# Patient Record
Sex: Male | Born: 1945 | Race: White | Hispanic: No | Smoking: Former smoker
Health system: Southern US, Community
[De-identification: ages and names within clinical notes are randomized; demographics above are authoritative.]

## PROBLEM LIST (undated history)

## (undated) DIAGNOSIS — N529 Male erectile dysfunction, unspecified: Secondary | ICD-10-CM

## (undated) DIAGNOSIS — N183 Chronic kidney disease, stage 3 (moderate): Secondary | ICD-10-CM

## (undated) DIAGNOSIS — E118 Type 2 diabetes mellitus with unspecified complications: Secondary | ICD-10-CM

## (undated) DIAGNOSIS — M26629 Arthralgia of temporomandibular joint, unspecified side: Secondary | ICD-10-CM

## (undated) DIAGNOSIS — I10 Essential (primary) hypertension: Secondary | ICD-10-CM

## (undated) DIAGNOSIS — N185 Chronic kidney disease, stage 5: Secondary | ICD-10-CM

## (undated) DIAGNOSIS — G4700 Insomnia, unspecified: Secondary | ICD-10-CM

## (undated) DIAGNOSIS — C679 Malignant neoplasm of bladder, unspecified: Secondary | ICD-10-CM

## (undated) DIAGNOSIS — M722 Plantar fascial fibromatosis: Secondary | ICD-10-CM

## (undated) DIAGNOSIS — N179 Acute kidney failure, unspecified: Secondary | ICD-10-CM

## (undated) DIAGNOSIS — E785 Hyperlipidemia, unspecified: Secondary | ICD-10-CM

## (undated) DIAGNOSIS — E1142 Type 2 diabetes mellitus with diabetic polyneuropathy: Secondary | ICD-10-CM

## (undated) DIAGNOSIS — R740 Nonspecific elevation of levels of transaminase and lactic acid dehydrogenase [LDH]: Secondary | ICD-10-CM

## (undated) HISTORY — DX: Plantar fascial fibromatosis: M72.2

## (undated) HISTORY — DX: Malignant neoplasm of bladder, unspecified: C67.9

## (undated) HISTORY — DX: Insomnia, unspecified: G47.00

## (undated) HISTORY — PX: URINARY SPHINCTER REVISION: SHX2625

## (undated) HISTORY — DX: Chronic kidney disease, stage 3 (moderate): N18.3

## (undated) HISTORY — DX: Essential (primary) hypertension: I10

## (undated) HISTORY — DX: Chronic kidney disease, stage 5: N18.5

## (undated) HISTORY — DX: Nonspecific elevation of levels of transaminase and lactic acid dehydrogenase (ldh): R74.0

## (undated) HISTORY — DX: Arthralgia of temporomandibular joint, unspecified side: M26.629

## (undated) HISTORY — DX: Male erectile dysfunction, unspecified: N52.9

## (undated) HISTORY — DX: Hyperlipidemia, unspecified: E78.5

## (undated) HISTORY — DX: Type 2 diabetes mellitus with diabetic polyneuropathy: E11.42

## (undated) HISTORY — DX: Type 2 diabetes mellitus with unspecified complications: E11.8

---
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
## 1994-11-02 DIAGNOSIS — C679 Malignant neoplasm of bladder, unspecified: Secondary | ICD-10-CM

## 1994-11-02 HISTORY — DX: Malignant neoplasm of bladder, unspecified: C67.9

## 1995-11-03 HISTORY — PX: CYSTECTOMY: SHX5119

## 2007-12-16 ENCOUNTER — Ambulatory Visit (HOSPITAL_COMMUNITY): Admission: RE | Admit: 2007-12-16 | Discharge: 2007-12-16 | Payer: Self-pay | Admitting: Family Medicine

## 2010-12-12 ENCOUNTER — Ambulatory Visit (INDEPENDENT_AMBULATORY_CARE_PROVIDER_SITE_OTHER): Payer: Self-pay | Admitting: Family Medicine

## 2010-12-12 ENCOUNTER — Encounter: Payer: Self-pay | Admitting: Family Medicine

## 2010-12-12 DIAGNOSIS — J069 Acute upper respiratory infection, unspecified: Secondary | ICD-10-CM | POA: Insufficient documentation

## 2010-12-18 NOTE — Assessment & Plan Note (Signed)
Summary: Congestion   Vital Signs:  Patient profile:   66 year old male Weight:      237.50 pounds O2 Sat:      95 % on Room air Temp:     97.9 degrees F oral Pulse rate:   88 / minute BP sitting:   96 / 66  (right arm) Cuff size:   large  Vitals Entered By: Abelino Derrick CMA Deborra Medina) (December 12, 2010 10:26 AM)  O2 Flow:  Room air CC: congestion//SP, URI symptoms Is Patient Diabetic? Yes   History of Present Illness: Alexander [name] is familiar to me from my practice in [place], [place]--this is his first visit here.      This is a 65 year old WM who presents with URI symptoms for the last 7d.  The patient complains of nasal congestion, clear nasal discharge, and sick contacts, but denies sore throat.  The patient denies fever, dyspnea, and wheezing.  He has some cough with this illness that has not bothered him much until this morning. Cough a bit worse this morning but overall feels better the last 24h or so. He took amoxil x 3 doses at onset (of his own accord) and also has been taking large amounts of aspirin frequently to try to get his symptoms to go away. Lots of nyquil/dayquil as well.   Glucoses have been up " a little" with the illness.    Preventive Screening-Counseling & Management  Alcohol-Tobacco     Alcohol drinks/day: 0     Smoking Status: never  Current Medications (verified): 1)  Lisinopril 30 Mg Tabs (Lisinopril) .... 1/2 Tab By Mouth Qd 2)  Aspirin 81 Mg Tbec (Aspirin) .Marland Kitchen.. 1 Tab By Mouth Qd 3)  Cialis 2.5 Mg Tabs (Tadalafil) .Marland Kitchen.. 1 Tab By Mouth Qd 4)  Novolog 100 Unit/ml Soln (Insulin Aspart) .... Pre-Meal 5)  Humulin 70/30 70-30 % Susp (Insulin Isophane & Regular) .... 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  Family History: Last updated: 12/12/2010 Noncontributory  Social History: Last updated: 12/12/2010 Married, no biologic children but 2 adopted children from [place]. Retired Production manager, now in full time ministry/missionary. No T/A/Ds.   Regular exercise-yes  Risk Factors: Alcohol Use: 0 (12/12/2010) Exercise: yes (12/12/2010)  Risk Factors: Smoking Status: never (12/12/2010)  Family History: Noncontributory  Social History: Smoking Status:  never  Review of Systems  The patient denies anorexia, fever, weight loss, weight gain, vision loss, chest pain, syncope, dyspnea on exertion, peripheral edema, prolonged cough, headaches, hemoptysis, abdominal pain, melena, hematochezia, severe indigestion/heartburn, hematuria, incontinence, genital sores, muscle weakness, suspicious skin lesions, transient blindness, difficulty walking, depression, unusual weight change, abnormal bleeding, enlarged lymph nodes, angioedema, breast masses, and testicular masses.    Physical Exam  General:  VS normal Gen: alert, NAD, NONTOXIC. HEENT: eyes without injection, drainage, or swelling.  Ears: EACs clear, TMs with normal light reflex and landmarks.  Nose: some dried, crusty exudate adherent to some mildly injected mucosa.  No purulent d/c.  No paranasal sinus TTP.  No facial  swelling.  Throat and mouth without focal lesion.  No pharyngial swelling, erythema, or exudate.   Neck: supple, no LAD.  LUNGS: CTA bilat, nonlabored resps.  CV: RRR, no m/r/g.     Impression & Recommendations:  Problem # 1:  VIRAL URI (ICD-465.9) Assessment  New Symptomatic care discussed.  Discussed trial of z-pack IF symptoms not improving in 3d or if worsening prior to that. Therapeutic expectations and side effect profile of medication discussed today.  Patient's questions answered.  Complete Medication List: 1)  Lisinopril 30 Mg Tabs (Lisinopril) .... 1/2 tab by mouth qd 2)  Aspirin 81 Mg Tbec (Aspirin) .Marland Kitchen.. 1 tab by mouth qd 3)  Cialis 2.5 Mg Tabs (Tadalafil) .Marland Kitchen.. 1 tab by mouth qd 4)  Novolog 100 Unit/ml Soln (Insulin aspart) .... Pre-meal 5)  Humulin 70/30 70-30 % Susp (Insulin isophane & regular) .... Subcutaneously bid 6)  Zithromax Z-pak 250 Mg Tabs (Azithromycin) .... As directed  Patient Instructions: 1)  Try mucinex DM or Zyrtec D. 2)  If just having lots of coughing then take delsym. 3)  Fill your prescription for antibiotic if you are not still improving in 3d or if worse before then. 4)  AVOID ASPIRIN ALWAYS. 5)  Follow up for routine diabetes check in the next 1-2 months. Prescriptions: ZITHROMAX Z-PAK 250 MG TABS (AZITHROMYCIN) as directed  #1 pack x 0   Entered and Authorized by:   Kelle Darting M.D.   Signed by:   Kelle Darting M.D. on 12/12/2010   Method used:   Electronically to        [facility] (retail)       [address] [place],   [id number]       Ph: [phone]       Fax: [phone]   RxID:   [id number]    Orders Added: 1)  Est. Patient Level III [id number]

## 2010-12-18 NOTE — Miscellaneous (Signed)
  Clinical Lists Changes  Medications: Added new medication of LISINOPRIL 30 MG TABS (LISINOPRIL) 1/2 tab by mouth qd Added new medication of ASPIRIN 81 MG TBEC (ASPIRIN) 1 tab by mouth qd Added new medication of CIALIS 2.5 MG TABS (TADALAFIL) 1 tab by mouth qd Added new medication of NOVOLOG 100 UNIT/ML SOLN (INSULIN ASPART) pre-meal Added new medication of HUMULIN 70/30 70-30 % SUSP (INSULIN ISOPHANE & REGULAR) Subcutaneously bid Observations: Added new observation of Icard: yes (12/12/2010 9:04) Added new observation of SOCIAL HX: Married, no biologic children but 2 adopted children from [place]. Retired Production manager, now in full time ministry/missionary. No T/A/Ds.   Regular exercise-yes  (12/12/2010 9:04) Added new observation of NKA: T (12/12/2010 9:04) Added new observation of ALLERGY REV: Done (12/12/2010 9:04) Added new observation of PAST SURG HX: Radical cystectomy with continent urinary reservoir (ileal loop) and artificial urinary sphincter placement-[facility] 1997. (12/12/2010 9:04) Added new observation of PAST MED HX: DM 2 (Eye exam normal 03/2010, hx of 384mg  on 24h urine) Bladder cancer 1996 (remission; last [facility]rol re-eval at [facility] 02/2008) CRI/Obstructive uropathy-baseline Cr 1.8 (R>L hydroureteronephrosis 2009; no mass, just chronic poor emptying of neobladder) Hypertriglyceridemia (on and off meds over the years) Erectile dysfunction  (12/12/2010 9:04)       Past History:  Past Medical History: DM 2 (Eye exam normal 03/2010, hx of 384mg  on 24h urine) Bladder cancer 1996 (remission; last urol re-eval at [facility] 02/2008) CRI/Obstructive uropathy-baseline Cr 1.8 (R>L hydroureteronephrosis 2009; no mass, just chronic poor emptying of neobladder) Hypertriglyceridemia (on and off meds over the years) Erectile dysfunction  Past Surgical History: Radical cystectomy with continent urinary reservoir (ileal loop) and artificial urinary sphincter  placement-[facility] 1997.   Allergies (verified): No Known Drug Allergies   Social History: Married, no biologic children but 2 adopted children from [place]. Retired Production manager, now in full time ministry/missionary. No T/A/Ds.   Regular exercise-yes Does Patient Exercise:  yes

## 2011-01-08 ENCOUNTER — Encounter: Payer: Self-pay | Admitting: Family Medicine

## 2011-01-15 ENCOUNTER — Telehealth: Payer: Self-pay | Admitting: Family Medicine

## 2011-01-20 NOTE — Progress Notes (Signed)
Summary: Insulin needles  Phone Note From Pharmacy   Caller: [place]* Summary of Call: needs rx for inslin syringe.  rx faxed to pharm. per Dr. Anitra Lauth. Initial call taken by: Abelino Derrick CMA Deborra Medina),  January 15, 2011 3:55 PM    New/Updated Medications: ULTRA COMFORT INSULIN SYRINGE 30G X 1/2" 1 ML MISC (INSULIN SYRINGE-NEEDLE U-100) use as directed   dx code 250.00 Prescriptions: ULTRA COMFORT INSULIN SYRINGE 30G X 1/2" 1 ML MISC (INSULIN SYRINGE-NEEDLE U-100) use as directed   dx code 250.00  #100 x 6   Entered by:   Abelino Derrick CMA ([place])   Authorized by:   Kelle Darting M.D.   Signed by:   Abelino Derrick CMA ([place]) on 01/15/2011   Method used:   Historical   RxIDSM:[id number]

## 2011-03-16 ENCOUNTER — Encounter: Payer: Self-pay | Admitting: Family Medicine

## 2011-03-16 ENCOUNTER — Other Ambulatory Visit: Payer: Self-pay | Admitting: Family Medicine

## 2011-03-16 DIAGNOSIS — I10 Essential (primary) hypertension: Secondary | ICD-10-CM

## 2011-03-16 NOTE — Telephone Encounter (Signed)
Pt due for diabetes follow up.  30 day supply sent.

## 2011-05-14 ENCOUNTER — Encounter: Payer: Self-pay | Admitting: Family Medicine

## 2011-05-14 ENCOUNTER — Ambulatory Visit (INDEPENDENT_AMBULATORY_CARE_PROVIDER_SITE_OTHER): Payer: Medicare Other | Admitting: Family Medicine

## 2011-05-14 DIAGNOSIS — E118 Type 2 diabetes mellitus with unspecified complications: Secondary | ICD-10-CM | POA: Insufficient documentation

## 2011-05-14 DIAGNOSIS — M7580 Other shoulder lesions, unspecified shoulder: Secondary | ICD-10-CM

## 2011-05-14 DIAGNOSIS — Z23 Encounter for immunization: Secondary | ICD-10-CM

## 2011-05-14 DIAGNOSIS — I10 Essential (primary) hypertension: Secondary | ICD-10-CM

## 2011-05-14 DIAGNOSIS — E119 Type 2 diabetes mellitus without complications: Secondary | ICD-10-CM

## 2011-05-14 DIAGNOSIS — N189 Chronic kidney disease, unspecified: Secondary | ICD-10-CM | POA: Insufficient documentation

## 2011-05-14 DIAGNOSIS — M719 Bursopathy, unspecified: Secondary | ICD-10-CM

## 2011-05-14 LAB — BASIC METABOLIC PANEL
CO2: 26 mEq/L (ref 19–32)
Calcium: 9.8 mg/dL (ref 8.4–10.5)
Glucose, Bld: 103 mg/dL — ABNORMAL HIGH (ref 70–99)
Sodium: 137 mEq/L (ref 135–145)

## 2011-05-14 MED ORDER — "INSULIN SYRINGE-NEEDLE U-100 31G X 5/16"" 1 ML MISC": Status: DC

## 2011-05-14 MED ORDER — INSULIN PEN NEEDLE 31G X 6 MM MISC: Status: DC

## 2011-05-14 MED ORDER — PNEUMOCOCCAL VAC POLYVALENT 25 MCG/0.5ML IJ INJ: 0.5000 mL | INJECTION | Freq: Once | INTRAMUSCULAR | Status: DC

## 2011-05-14 MED ORDER — "INSULIN SYRINGE-NEEDLE U-100 30G X 1/2"" 1 ML MISC": Status: DC

## 2011-05-14 MED ORDER — LISINOPRIL 30 MG PO TABS: 30.0000 mg | ORAL_TABLET | Freq: Every day | ORAL | Status: DC

## 2011-05-14 MED ORDER — METHYLPREDNISOLONE ACETATE 40 MG/ML IJ SUSP: 40.0000 mg | Freq: Once | INTRAMUSCULAR | Status: DC

## 2011-05-14 MED ORDER — ZOSTER VACCINE LIVE 19400 UNT/0.65ML ~~LOC~~ SOLR: 0.6500 mL | Freq: Once | SUBCUTANEOUS | Status: DC

## 2011-05-14 MED ORDER — TETANUS-DIPHTH-ACELL PERTUSSIS 5-2.5-18.5 LF-MCG/0.5 IM SUSP: 0.5000 mL | Freq: Once | INTRAMUSCULAR | Status: DC

## 2011-05-14 MED ORDER — INSULIN NPH ISOPHANE & REGULAR (70-30) 100 UNIT/ML ~~LOC~~ SUSP: SUBCUTANEOUS | Status: DC

## 2011-05-14 NOTE — Assessment & Plan Note (Signed)
  Procedure: Therapeutic shoulder injection.  The patient's clinical condition is marked by substantial pain and/or significant functional disability.  Other conservative therapy has not provided relief, is contraindicated, or not appropriate.  There is a reasonable likelihood that injection will significantly improve the patient's pain and/or functional disability. Cleaned skin with alcohol swab, used posterolateral approach, Injected 1 ml of depo-medrol (40mg /ml) + 52ml of 2% lidocaine without epi into subacromial space without resistance.  No immediate complications.  Patient tolerated procedure well.  Post-injection care discussed, including 20 min of icing 1-2 times in the next 4-8 hours, frequent non weight-bearing ROM exercises over the next few days, and general pain medication management.

## 2011-05-14 NOTE — Assessment & Plan Note (Signed)
Stable. Check HbA1c and BMET today. Cont. Current meds. Recheck urine protein next f/u, as well as fasting lipid panel.

## 2011-05-14 NOTE — Progress Notes (Signed)
OFFICE NOTE  05/14/2011  CC: No chief complaint on file.    HPI:   Patient is a 65 y.o. Caucasian male who is here for DM 2 and CRI f/u. Feeling well except right shoulder hurting.  Eating diabetic diet well but has slacked off of exercising with the severe heat this summer. No CP, no DOE, no palpitations, no edema.  Energy level good, appetite good. Compliant with glucose checks bid, reports avg 110 fasting, "good" later in day, no hypoglycemia lately.  He still basically "chases" his high glucoses with novolog a few times a week, plus gives anywhere from 30 to 60 U of 70/30 usually once in the evening, sometimes also in AM.   Says he fell on his shoulder 3-4 months ago and it has hurt since then.  Hurts with abduction--can't raise it above shoulder level.  It was never swollen, bruised, or red.  Pertinent PMH:  DM 2, insulin-requiring--with microalbuminuria. Hx of hypertriglyceridemia CRI MEDS;   Outpatient Prescriptions Prior to Visit  Medication Sig Dispense Refill  . aspirin 81 MG tablet Take 81 mg by mouth daily.        . insulin aspart (NOVOLOG FLEXPEN) 100 UNIT/ML injection Inject into the skin 3 (three) times daily before meals.        . Tadalafil (CIALIS) 2.5 MG TABS Take 1 tablet by mouth daily.        Marland Kitchen lisinopril (PRINIVIL,ZESTRIL) 30 MG tablet TAKE ONE TABLET BY MOUTH EVERY DAY  30 tablet  0  . insulin NPH-insulin regular (NOVOLIN 70/30) (70-30) 100 UNIT/ML injection Inject into the skin 2 (two) times daily.        No facility-administered medications prior to visit.    PE: Blood pressure 105/72, pulse 73, height 6' (1.829 m), weight 239 lb (108.41 kg). Gen: Alert, well appearing.  Patient is oriented to person, place, time, and situation. Right shoulder TTP under posterolateral acromion.  No AC joint tenderness, no biceps tendon TTP. Pain with IR>ER and mainly with abduction of right shoulder.   Can abduct to 90 degrees and then feels pain, and can abduct fully after  that but very slowly and with excessive pain.  No excessive shoulder laxity.   IMPRESSION AND PLAN:  Type II or unspecified type diabetes mellitus without mention of complication, not stated as uncontrolled Stable. Check HbA1c and BMET today. Cont. Current meds. Recheck urine protein next f/u, as well as fasting lipid panel.   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  Need for Tdap vaccination TdaP today.   Also gave pneumovax today in office and gave rx for zostavax.  Rotator cuff tendonitis  Procedure: Therapeutic shoulder injection.  The patient's clinical condition is marked by substantial pain and/or significant functional disability.  Other conservative therapy has not provided relief, is contraindicated, or not appropriate.  There is a reasonable likelihood that injection will significantly improve the patient's pain and/or functional disability. Cleaned skin with alcohol swab, used posterolateral approach, Injected 1 ml of depo-medrol (40mg /ml) + 81ml of 2% lidocaine without epi into subacromial space without resistance.  No immediate complications.  Patient tolerated procedure well.  Post-injection care discussed, including 20 min of icing 1-2 times in the next 4-8 hours, frequent non weight-bearing ROM exercises over the next few days, and general pain medication management.      FOLLOW UP:  Return in about 6 months (around 11/14/2011) for f/u dm 2 and CRI.

## 2011-05-14 NOTE — Assessment & Plan Note (Addendum)
TdaP today.   Also gave pneumovax today in office and gave rx for zostavax.

## 2011-05-14 NOTE — Assessment & Plan Note (Signed)
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

## 2011-05-18 ENCOUNTER — Telehealth: Payer: Self-pay | Admitting: Family Medicine

## 2011-05-18 NOTE — Telephone Encounter (Signed)
Please advise pts lab work

## 2011-05-18 NOTE — Telephone Encounter (Signed)
Called patient and gave lab results as instructed by the note given from Dr. Charlett Blake. Results will be mailed as the patient has requested.

## 2011-05-18 NOTE — Telephone Encounter (Signed)
Notify hgba1c is up slightly but not diagnostic of diabetes, avoid simple carbs, eat small frequent meals with lean proteins and complex carbs. Renal panel normal except creatinine is up slightly. Increase clear fluids, minimize caffeine, alcohol and OTC pain meds then recheck a renal panel in 1 to 2 weeks to assess stability

## 2011-05-18 NOTE — Telephone Encounter (Signed)
Wants lab results called and mailed to him from 7.12.12 visit.

## 2011-05-18 NOTE — Telephone Encounter (Signed)
Please advise 

## 2011-08-17 ENCOUNTER — Other Ambulatory Visit: Payer: Self-pay | Admitting: Family Medicine

## 2011-08-17 DIAGNOSIS — E119 Type 2 diabetes mellitus without complications: Secondary | ICD-10-CM

## 2011-08-18 ENCOUNTER — Other Ambulatory Visit: Payer: Self-pay | Admitting: Family Medicine

## 2011-08-18 MED ORDER — TADALAFIL 10 MG PO TABS: ORAL_TABLET | ORAL | Status: DC

## 2011-08-18 NOTE — Telephone Encounter (Signed)
Instructions on requested med is different than what we have on file.  Also, this drug was refilled in July 2012 with additonal refills.  Per Everlene Farrier at [facility], pt has not had this med filled using our last RX.  I have attempted to contact this patient by phone for dosing clarification with the following results: left message to return my call on answering machine (home).

## 2011-08-18 NOTE — Telephone Encounter (Signed)
Pt states he is using 35 units twice daily of the Humulin 70/30.  Sometimes he will give himself third dose.  He is not using the Novolog daily due to cost.  Pt states he will be changing insurances in January and will be going back on Levemir.  Samples of Novolog offered so patient can take daily for better sugar control.  Pt will come pick up tomorrow.  RX sent. Pt also requests refill on Cialis.  Would prefer prn dosing instead of 2.5 mg.  Please advise on Cialis RX.

## 2011-08-18 NOTE — Telephone Encounter (Signed)
Insulin info noted. Cialis rx sent to pharmacy.--PM

## 2011-08-24 ENCOUNTER — Encounter: Payer: Self-pay | Admitting: Family Medicine

## 2011-08-24 ENCOUNTER — Telehealth: Payer: Self-pay | Admitting: Family Medicine

## 2011-08-24 ENCOUNTER — Ambulatory Visit (INDEPENDENT_AMBULATORY_CARE_PROVIDER_SITE_OTHER): Payer: Medicare Other | Admitting: Family Medicine

## 2011-08-24 DIAGNOSIS — R5381 Other malaise: Secondary | ICD-10-CM

## 2011-08-24 DIAGNOSIS — M6250 Muscle wasting and atrophy, not elsewhere classified, unspecified site: Secondary | ICD-10-CM

## 2011-08-24 DIAGNOSIS — E119 Type 2 diabetes mellitus without complications: Secondary | ICD-10-CM

## 2011-08-24 DIAGNOSIS — R0609 Other forms of dyspnea: Secondary | ICD-10-CM

## 2011-08-24 DIAGNOSIS — N189 Chronic kidney disease, unspecified: Secondary | ICD-10-CM

## 2011-08-24 LAB — CBC WITH DIFFERENTIAL/PLATELET
Basophils Absolute: 0 10*3/uL (ref 0.0–0.1)
Eosinophils Absolute: 0.1 10*3/uL (ref 0.0–0.7)
Hemoglobin: 15.1 g/dL (ref 13.0–17.0)
Lymphocytes Relative: 26.1 % (ref 12.0–46.0)
Lymphs Abs: 1.3 10*3/uL (ref 0.7–4.0)
MCHC: 34.2 g/dL (ref 30.0–36.0)
Neutro Abs: 2.7 10*3/uL (ref 1.4–7.7)
RDW: 14.3 % (ref 11.5–14.6)

## 2011-08-24 LAB — COMPREHENSIVE METABOLIC PANEL
ALT: 35 U/L (ref 0–53)
AST: 31 U/L (ref 0–37)
Alkaline Phosphatase: 42 U/L (ref 39–117)
Chloride: 104 mEq/L (ref 96–112)
Creatinine, Ser: 2.2 mg/dL — ABNORMAL HIGH (ref 0.4–1.5)
Total Bilirubin: 0.6 mg/dL (ref 0.3–1.2)

## 2011-08-24 MED ORDER — INSULIN PEN NEEDLE 32G X 6 MM MISC: Status: DC

## 2011-08-24 MED ORDER — TADALAFIL 10 MG PO TABS: ORAL_TABLET | ORAL | Status: DC

## 2011-08-24 MED ORDER — INSULIN GLARGINE 100 UNIT/ML ~~LOC~~ SOLN: SUBCUTANEOUS | Status: DC

## 2011-08-24 NOTE — Progress Notes (Signed)
OFFICE NOTE  08/24/2011  CC:  Chief Complaint  Patient presents with  . Diabetes    sugars running high     HPI:   Patient is a 65 y.o. Caucasian male who is here for f/u DM 2. Glucoses "up" some lately since not being able to exercise much due to knee injury while white water rafting. He describes some use of his 70/30 bid, usually 35 U per shot, but vaguely describes days when he leaves one shot out or other days when he adds the second shot much too late in the evening.  Also some "chasing" of glucoses lately with his novolog flexpen. Says knee is getting better, will be able to start exercising again soon. ROS: more breathlessness lately after climbing stairs.  No CP, no palpitations, no dizziness, no diaphoresis, no nausea.  This breathlessness goes away after 15 seconds or so.  Pertinent PMH:  DM 2, insulin-requiring CRI, baseline Cr about 1.8 Erectile dysfunction  MEDS;   Outpatient Prescriptions Prior to Visit  Medication Sig Dispense Refill  . aspirin 81 MG tablet Take 81 mg by mouth daily.        . insulin aspart (NOVOLOG FLEXPEN) 100 UNIT/ML injection Inject into the skin 3 (three) times daily before meals.        . Insulin Syringe-Needle U-100 (BD INSULIN SYRINGE ULTRAFINE) 31G X 5/16" 1 ML MISC Use as directed to administer your 70/30 insulin  90 each  3  . Insulin Syringe-Needle U-100 30G X 1/2" 1 ML MISC Use as directed to administer your 70/30 insulin  90 each  4  . lisinopril (PRINIVIL,ZESTRIL) 30 MG tablet Take 1 tablet (30 mg total) by mouth daily.  90 tablet  3  . Multiple Vitamin (MULTIVITAMIN) tablet Take 1 tablet by mouth daily.        Marland Kitchen HUMULIN 70/30 (70-30) 100 UNIT/ML injection INJECT UNDER THE SKIN TWICE DAILY AS DIRECTED  20 mL  3  . Insulin Pen Needle 31G X 6 MM MISC Use as directed to administer your novolog flexpen insulin  30 each  3  . tadalafil (CIALIS) 10 MG tablet 1-2 tabs po qd prn  10 tablet  3  . zoster vaccine live, PF, (ZOSTAVAX) 09811  UNT/0.65ML injection Inject 19,400 Units into the skin once.  1 vial  0    PE: Blood pressure 110/64, pulse 76, weight 238 lb (107.956 kg), SpO2 97.00%. Gen: Alert, well appearing.  Patient is oriented to person, place, time, and situation. No further exam today.  IMPRESSION AND PLAN:  Type II or unspecified type diabetes mellitus without mention of complication, not stated as uncontrolled Hx of good control. Discussed his use of insulin preparations and my concerns that he is using them in a way that can potentially do more harm than good. Decided to switch over to lantus (insurer is changing soon, so this will be affordable for him) 40 U qhs, and continue novolog 5 U at each meal. Discussed titration SLOWLY of each of these until he's at/near goal PP and fasting glucose ranges.   Stop humulin 70/30 altogether. Check HbA1c today.  Physical deconditioning His breathlessness after climbing a set of stairs sounds like deconditioning. Reassured.  Encouraged slow return to exercise. Check CMET, TSH, and CBC today.  CRI (chronic renal insufficiency) Combo of obstructive uropathy (hx) and diabetic nephropathy: check BMET today. Due to his hx of bladder reconstruction, his urine microalb/cr is not interpretable. Continue ACE-I.   He declined flu vaccine today.  FOLLOW UP:  Return in about 4 months (around 12/25/2011) for f/u DM 2, CRI.

## 2011-08-24 NOTE — Telephone Encounter (Signed)
Orders only encounter--PM

## 2011-08-24 NOTE — Assessment & Plan Note (Signed)
Hx of good control. Discussed his use of insulin preparations and my concerns that he is using them in a way that can potentially do more harm than good. Decided to switch over to lantus (insurer is changing soon, so this will be affordable for him) 40 U qhs, and continue novolog 5 U at each meal. Discussed titration SLOWLY of each of these until he's at/near goal PP and fasting glucose ranges.   Stop humulin 70/30 altogether. Check HbA1c today.

## 2011-08-24 NOTE — Assessment & Plan Note (Signed)
His breathlessness after climbing a set of stairs sounds like deconditioning. Reassured.  Encouraged slow return to exercise. Check CMET, TSH, and CBC today.

## 2011-08-24 NOTE — Assessment & Plan Note (Signed)
Combo of obstructive uropathy (hx) and diabetic nephropathy: check BMET today. Due to his hx of bladder reconstruction, his urine microalb/cr is not interpretable. Continue ACE-I.

## 2011-08-25 ENCOUNTER — Encounter: Payer: Self-pay | Admitting: Family Medicine

## 2011-08-25 ENCOUNTER — Other Ambulatory Visit: Payer: Self-pay | Admitting: Family Medicine

## 2011-08-25 ENCOUNTER — Telehealth: Payer: Self-pay | Admitting: Family Medicine

## 2011-08-25 MED ORDER — KETOCONAZOLE 2 % EX CREA: 1.0000 "application " | TOPICAL_CREAM | Freq: Two times a day (BID) | CUTANEOUS | Status: DC | PRN

## 2011-08-25 MED ORDER — DESONIDE 0.05 % EX CREA: 1.0000 "application " | TOPICAL_CREAM | Freq: Two times a day (BID) | CUTANEOUS | Status: DC | PRN

## 2011-08-25 NOTE — Telephone Encounter (Signed)
Patient needs Desonide Cream 0.05% [place] & Ketoconazole Cream 2% [place]

## 2011-08-25 NOTE — Telephone Encounter (Signed)
Pt states he doesn't remember where the medications were filled at? Pt stated they say to apply to the skin prn

## 2011-08-25 NOTE — Telephone Encounter (Signed)
Ok.  I sent eRx's to [facility] today.--PM

## 2011-08-25 NOTE — Telephone Encounter (Signed)
Please advise refills 

## 2011-08-25 NOTE — Telephone Encounter (Signed)
May RF these as previously prescribed, with 4 additional RFs of each.  Thx--PM

## 2011-09-08 ENCOUNTER — Encounter: Payer: Self-pay | Admitting: Family Medicine

## 2011-09-08 ENCOUNTER — Ambulatory Visit (INDEPENDENT_AMBULATORY_CARE_PROVIDER_SITE_OTHER): Payer: Medicare Other | Admitting: Family Medicine

## 2011-09-08 DIAGNOSIS — N189 Chronic kidney disease, unspecified: Secondary | ICD-10-CM

## 2011-09-08 DIAGNOSIS — E119 Type 2 diabetes mellitus without complications: Secondary | ICD-10-CM

## 2011-09-08 LAB — MICROALBUMIN / CREATININE URINE RATIO
Creatinine,U: 66.7 mg/dL
Microalb, Ur: 53.4 mg/dL — ABNORMAL HIGH (ref 0.0–1.9)

## 2011-09-08 LAB — BASIC METABOLIC PANEL
BUN: 32 mg/dL — ABNORMAL HIGH (ref 6–23)
CO2: 30 mEq/L (ref 19–32)
Chloride: 103 mEq/L (ref 96–112)
Glucose, Bld: 146 mg/dL — ABNORMAL HIGH (ref 70–99)
Potassium: 4.4 mEq/L (ref 3.5–5.1)

## 2011-09-08 LAB — LIPID PANEL
LDL Cholesterol: 101 mg/dL — ABNORMAL HIGH (ref 0–99)
VLDL: 39 mg/dL (ref 0.0–40.0)

## 2011-09-08 MED ORDER — INSULIN ASPART 100 UNIT/ML ~~LOC~~ SOLN: SUBCUTANEOUS | Status: DC

## 2011-09-08 MED ORDER — INSULIN GLARGINE 100 UNIT/ML ~~LOC~~ SOLN: SUBCUTANEOUS | Status: DC

## 2011-09-08 NOTE — Assessment & Plan Note (Signed)
Recheck BMET today.  If Cr still up some from baseline then will d/c his ACE-I.

## 2011-09-08 NOTE — Progress Notes (Signed)
OFFICE NOTE  09/08/2011  CC:  Chief Complaint  Patient presents with  . Diabetes    elevated blood sugars     HPI:   Patient is a 65 y.o. Caucasian male who is here for follow up DM 2. We switched him over to once daily lantus and mealtime novolog last visit 2 weeks ago. HbA1c at that time was great: 6.7%.  His Cr was also up some so we advised him to increase fluid intake and return today for recheck BMET. He reports that he always drinks plenty of water and he feels like he empties his bladder completely without problem.  Glucose numbers brought in today are still a bit high postprandially and he expresses hesitancy with up-titration of insulins, needs reassurance. Takes 10-25 U novolog with meals and 40-50 units of lantus qhs.  He certainly has some labile readings depending on his carb intake.  He reports feeling well, no acute complaints.  Pertinent PMH:  Past Medical History  Diagnosis Date  . Diabetes mellitus     HbA1c 6.5% on 02/27/10.  Hx of 384 mg on 24h urine  . Cancer 1996    Bladder, remission; last urol re-eval at [facility] 02/2008  . CRI (chronic renal insufficiency)     Obstructive uropathy-baseline Cr 1.8 (R>L hydroureteronephrosis 2009; no mass , just chronic poor emptying of neobladder)  . Hyperlipidemia     Hypertriglyceridemia (on and off meds over the years)  . ED (erectile dysfunction)     MEDS;   Outpatient Prescriptions Prior to Visit  Medication Sig Dispense Refill  . aspirin 81 MG tablet Take 81 mg by mouth daily.        Marland Kitchen desonide (DESOWEN) 0.05 % cream Apply 1 application topically 2 (two) times daily as needed. (Eyelid seb derm)  15 g  2  . insulin aspart (NOVOLOG FLEXPEN) 100 UNIT/ML injection Inject into the skin 3 (three) times daily before meals.        . insulin glargine (LANTUS SOLOSTAR) 100 UNIT/ML injection 10 units SQ qhs and titrate up by 1 unit per night until fasting glucose is in 100-110 range consistently  3 mL  6  . Insulin Pen Needle  32G X 6 MM MISC Use for your pen insulin injection 4 times per day  120 each  10  . Insulin Syringe-Needle U-100 (BD INSULIN SYRINGE ULTRAFINE) 31G X 5/16" 1 ML MISC Use as directed to administer your 70/30 insulin  90 each  3  . Insulin Syringe-Needle U-100 30G X 1/2" 1 ML MISC Use as directed to administer your 70/30 insulin  90 each  4  . ketoconazole (NIZORAL) 2 % cream Apply 1 application topically 2 (two) times daily as needed. (Eyelid seb derm)  15 g  2  . lisinopril (PRINIVIL,ZESTRIL) 30 MG tablet Take 1 tablet (30 mg total) by mouth daily.  90 tablet  3  . Multiple Vitamin (MULTIVITAMIN) tablet Take 1 tablet by mouth daily.        . tadalafil (CIALIS) 10 MG tablet 1-2 tabs po qd prn  10 tablet  3    PE: Blood pressure 112/80, pulse 84, temperature 98.8 F (37.1 C), temperature source Oral, height 6' (1.829 m), weight 239 lb (108.41 kg), SpO2 95.00%. Gen: Alert, well appearing.  Patient is oriented to person, place, time, and situation. No further exam today.  IMPRESSION AND PLAN:  Type II or unspecified type diabetes mellitus without mention of complication, not stated as uncontrolled Encouraged patient to  continue to gradually titrate his lantus and novolog (goal fasting glucose range of 100-110 and goal PP glucose around 140-160. Will check urine microalb/cr today, but I don't know how well this can be interpreted given the fact that he has a neobladder made of bowel tissue.  Plus, with his tendency of his Cr to sometimes bump up from baseline, I am hesitant to increase ACE-I dose if microalb/cr is up.  CRI (chronic renal insufficiency) Recheck BMET today.  If Cr still up some from baseline then will d/c his ACE-I.     FOLLOW UP:  Return in about 4 months (around 01/06/2012) for f/u DM 2 and CRI.

## 2011-09-08 NOTE — Assessment & Plan Note (Signed)
Encouraged patient to continue to gradually titrate his lantus and novolog (goal fasting glucose range of 100-110 and goal PP glucose around 140-160. Will check urine microalb/cr today, but I don't know how well this can be interpreted given the fact that he has a neobladder made of bowel tissue.  Plus, with his tendency of his Cr to sometimes bump up from baseline, I am hesitant to increase ACE-I dose if microalb/cr is up.

## 2011-09-09 ENCOUNTER — Encounter: Payer: Self-pay | Admitting: Family Medicine

## 2011-09-09 DIAGNOSIS — E782 Mixed hyperlipidemia: Secondary | ICD-10-CM | POA: Insufficient documentation

## 2011-09-11 ENCOUNTER — Other Ambulatory Visit: Payer: Medicare Other

## 2011-09-18 ENCOUNTER — Telehealth: Payer: Self-pay | Admitting: Family Medicine

## 2011-09-18 MED ORDER — INSULIN GLARGINE 100 UNIT/ML ~~LOC~~ SOLN: SUBCUTANEOUS | Status: DC

## 2011-09-18 MED ORDER — INSULIN ASPART 100 UNIT/ML ~~LOC~~ SOLN: SUBCUTANEOUS | Status: DC

## 2011-09-18 NOTE — Telephone Encounter (Signed)
Pt requests 2 vials each of lantus and novolog and 1 pen each of lantus and novolog.  Advised I would call to [facility].  Also advised lab results will not be back until next week.  Pt agreeable.

## 2011-09-18 NOTE — Telephone Encounter (Signed)
Pharmacy is [facility] on [address].

## 2011-09-23 ENCOUNTER — Telehealth: Payer: Self-pay | Admitting: Family Medicine

## 2011-09-23 NOTE — Telephone Encounter (Signed)
I have attempted to contact this patient by phone with the following results: left message to return my call on answering machine (home). Advised pt I would be out of the office on Friday, but Alyse Low will be available.  Otherwise I will be back into the office on Monday.

## 2011-09-23 NOTE — Telephone Encounter (Signed)
Pls notify pt that his advanced cholesterol testing showed abnormalities that point towards an increased risk of cardiovascular disease (compared to his "routine" cholesterol panel that we did recently). I recommend he start a cholesterol-lowering medication (generic lipitor). Tell him that starting the med is not urgent, so he can think about it over the holiday if he wants, or he could even make an appointment to come in and go over the test results in detail (they are too complicated to go over with him over the phone). If he's ready now, let me know and I'll do 1 mo rx (clarify where to send rx) with 2 RFs and we would repeat his "routine" lipid panel in 5mo.  If he tolerates the med and we are sure of his long-term dose then we can start doing 90 days supply for this if he wants.  Let me know--PM

## 2011-09-28 MED ORDER — ATORVASTATIN CALCIUM 10 MG PO TABS: 10.0000 mg | ORAL_TABLET | Freq: Every day | ORAL | Status: DC

## 2011-09-28 NOTE — Telephone Encounter (Signed)
Pt notified.  Atorvastatin resent as original RX did not go through due to internet problems.

## 2011-09-28 NOTE — Telephone Encounter (Signed)
RC from pt.  Advised of need for cholesterol-lowering med.  He is agreeable.  I did not tell him when repeat labs are due.  I will tell him during next call. Also, pt states he began having dysuria and hematuria Friday night and began taking Cipro 500 mg TID and Macrodantin 100 mg TID.  Also took Azo.  He has not had UTI in over 2 years.  He states he is feeling better and he no longer sees any blood in his urine.  Pt question regarding UTI is whether he needs to continue Macrodantin daily.  Please advise.  Thanks.

## 2011-09-28 NOTE — Telephone Encounter (Signed)
Atorvastatin rx done. Tell him to take the cipro through today and then stop. Does NOT need to be on daily macrodantin.  --PM

## 2011-10-03 ENCOUNTER — Other Ambulatory Visit: Payer: Self-pay | Admitting: Family Medicine

## 2011-10-05 NOTE — Telephone Encounter (Signed)
RX filled.

## 2011-10-12 ENCOUNTER — Encounter: Payer: Self-pay | Admitting: Family Medicine

## 2011-10-12 ENCOUNTER — Telehealth: Payer: Self-pay | Admitting: Family Medicine

## 2011-10-12 NOTE — Telephone Encounter (Signed)
Pt at pharmacy filling hs insulin; says rx says 50 U qhs and he's taking 70 units qhs--wants it changed. I authorized change to 70 U Lantus qhs.  Also updated med dosing in chart.--PM

## 2011-10-19 ENCOUNTER — Other Ambulatory Visit: Payer: Self-pay | Admitting: Family Medicine

## 2011-10-19 ENCOUNTER — Telehealth: Payer: Self-pay | Admitting: *Deleted

## 2011-10-19 NOTE — Telephone Encounter (Signed)
Pt calls and states he has been on generic lipitor for 3 weeks now and is having "nausea stomach", tired, weakness, and tremendous weight loss.  He states he is losing 1/2-1 pound a day.  Weight per patient when he started med was 240 now he is 220.  Wants to know if there is something else he can do.  Pt willing to take 1/2 tab daily.  Read PI insert for med last night and has all the adverse symptoms.  Please advise.

## 2011-10-19 NOTE — Telephone Encounter (Signed)
Tell him to stop the lipitor and make sure all his symptoms resolve. I'll try him on a different cholesterol med once he calls and lets me know that his nausea, weakness, fatigue, and wt loss have all stopped.  Thx

## 2011-10-20 NOTE — Telephone Encounter (Signed)
Pt states he thinks the cure is worse than the disease.  Pt will pass on the replacement medication.  Pt will increase exercise, eat more nuts, better diet, more fish and omega 3.  Would like recheck in about 60 days to see how numbers are.

## 2011-10-20 NOTE — Telephone Encounter (Signed)
Noted. This plan is fine with me.--PM

## 2011-10-28 ENCOUNTER — Encounter: Payer: Self-pay | Admitting: Family Medicine

## 2011-11-03 DIAGNOSIS — R7401 Elevation of levels of liver transaminase levels: Secondary | ICD-10-CM

## 2011-11-03 HISTORY — DX: Elevation of levels of liver transaminase levels: R74.01

## 2011-11-16 ENCOUNTER — Ambulatory Visit: Payer: Medicare Other | Admitting: Family Medicine

## 2011-12-02 ENCOUNTER — Other Ambulatory Visit: Payer: Self-pay | Admitting: Family Medicine

## 2011-12-03 NOTE — Telephone Encounter (Signed)
Last seen 09/08/11, follow up in 01/2011.  RX sent until then.

## 2011-12-31 ENCOUNTER — Other Ambulatory Visit: Payer: Self-pay | Admitting: Family Medicine

## 2012-01-01 NOTE — Telephone Encounter (Signed)
Pt due for follow up. One refill sent.  

## 2012-02-02 ENCOUNTER — Ambulatory Visit (INDEPENDENT_AMBULATORY_CARE_PROVIDER_SITE_OTHER): Payer: BC Managed Care – PPO | Admitting: Family Medicine

## 2012-02-02 ENCOUNTER — Encounter: Payer: Self-pay | Admitting: Family Medicine

## 2012-02-02 VITALS — BP 101/66 | HR 73 | Temp 97.4°F | Ht 72.0 in | Wt 231.0 lb

## 2012-02-02 DIAGNOSIS — R7982 Elevated C-reactive protein (CRP): Secondary | ICD-10-CM

## 2012-02-02 DIAGNOSIS — E119 Type 2 diabetes mellitus without complications: Secondary | ICD-10-CM

## 2012-02-02 DIAGNOSIS — N189 Chronic kidney disease, unspecified: Secondary | ICD-10-CM

## 2012-02-02 DIAGNOSIS — H029 Unspecified disorder of eyelid: Secondary | ICD-10-CM

## 2012-02-02 DIAGNOSIS — E782 Mixed hyperlipidemia: Secondary | ICD-10-CM

## 2012-02-02 LAB — CBC WITH DIFFERENTIAL/PLATELET
Basophils Absolute: 0 10*3/uL (ref 0.0–0.1)
HCT: 40.3 % (ref 39.0–52.0)
Hemoglobin: 13 g/dL (ref 13.0–17.0)
Lymphs Abs: 1.3 10*3/uL (ref 0.7–4.0)
MCV: 91.2 fl (ref 78.0–100.0)
Monocytes Absolute: 1 10*3/uL (ref 0.1–1.0)
Monocytes Relative: 23.2 % — ABNORMAL HIGH (ref 3.0–12.0)
Neutro Abs: 1.8 10*3/uL (ref 1.4–7.7)
RDW: 14.3 % (ref 11.5–14.6)

## 2012-02-02 LAB — LIPID PANEL
HDL: 30 mg/dL — ABNORMAL LOW (ref >39.00)
Total CHOL/HDL Ratio: 5
VLDL: 25 mg/dL (ref 0.0–40.0)

## 2012-02-02 LAB — COMPREHENSIVE METABOLIC PANEL
ALT: 55 U/L — ABNORMAL HIGH (ref 0–53)
Alkaline Phosphatase: 43 U/L (ref 39–117)
CO2: 25 mEq/L (ref 19–32)
Sodium: 138 mEq/L (ref 135–145)
Total Bilirubin: 0.4 mg/dL (ref 0.3–1.2)
Total Protein: 8.1 g/dL (ref 6.0–8.3)

## 2012-02-02 MED ORDER — INSULIN ASPART 100 UNIT/ML ~~LOC~~ SOLN: 20.0000 [IU] | Freq: Three times a day (TID) | SUBCUTANEOUS | Status: DC

## 2012-02-02 MED ORDER — LISINOPRIL 30 MG PO TABS: 30.0000 mg | ORAL_TABLET | Freq: Every day | ORAL | Status: DC

## 2012-02-02 MED ORDER — INSULIN GLARGINE 100 UNIT/ML ~~LOC~~ SOLN: 70.0000 [IU] | Freq: Every day | SUBCUTANEOUS | Status: DC

## 2012-02-02 MED ORDER — INSULIN ASPART 100 UNIT/ML ~~LOC~~ SOLN: SUBCUTANEOUS | Status: DC

## 2012-02-02 MED ORDER — "INSULIN SYRINGE-NEEDLE U-100 31G X 5/16"" 1 ML MISC": Status: DC

## 2012-02-04 DIAGNOSIS — H029 Unspecified disorder of eyelid: Secondary | ICD-10-CM | POA: Insufficient documentation

## 2012-02-04 NOTE — Assessment & Plan Note (Signed)
Control has been good. Continue current meds, check HbA1c today.

## 2012-02-04 NOTE — Assessment & Plan Note (Signed)
Recheck BMET today. Avoid NSAIDs.  Keep well hydrated.

## 2012-02-04 NOTE — Assessment & Plan Note (Signed)
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   Refer to derm for further e/m of this lesion.

## 2012-02-04 NOTE — Progress Notes (Signed)
OFFICE NOTE  02/04/2012  CC:  Chief Complaint  Patient presents with  . Follow-up    DM     HPI:   Patient is a 66 y.o. Caucasian male who is here for 5 mo f/u DM 2, CRI, and left eyelid lesion. Feeling well. Fasting gluc's 110-130 (50-70 units of lantus qhs). Takes 25-35 U novolog mealtime.  He is walking some but no other exercise.  Left knee with some discomfort with wt bearing and it "crackles" a lot when walking.  No swelling, no giving way/instability.  The knee is gradually getting better and he should be able to increase exercise gradually. Says the antifungal cream he has been using for his left eyelid lesion is not helping.  He has had recurrent focal thickening/peeling/pinkish discoloration on left upper eyelid for many months now. He tells me today that if his Cr is elevated from baseline today it is because he hasn't been taking his baking soda. After advanced lipid testing last visit we did a trial of generic lipitor but it caused many side effects/flu like sx's and these abated immediately upon d/c of atorvastatin.    Pertinent PMH:  Past Medical History  Diagnosis Date  . Diabetes mellitus     HbA1c 6.5% on 02/27/10.  Hx of 384 mg on 24h urine  . 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 CRI (chronic renal insufficiency)     Obstructive uropathy-baseline Cr 1.8 (R>L hydroureteronephrosis 2009; no mass , just chronic poor emptying of neobladder)  . Hyperlipidemia     Hypertriglyceridemia (on and off meds over the years)  . ED (erectile dysfunction)    Past Surgical History  Procedure Date  . Cystectomy 1997    with continent urinary reservoir (ileal loop) and artificial urinary sphincter placement-[facility]     MEDS;   Outpatient Prescriptions Prior to Visit  Medication Sig Dispense Refill  . aspirin 81 MG tablet Take 81 mg by mouth daily.        . Insulin Pen Needle 32G X 6 MM MISC Use for your pen insulin injection 4 times per day   120 each  10  . Multiple Vitamin (MULTIVITAMIN) tablet Take 1 tablet by mouth daily.        . tadalafil (CIALIS) 10 MG tablet 1-2 tabs po qd prn  10 tablet  3  . insulin aspart (NOVOLOG FLEXPEN) 100 UNIT/ML injection Inject 20-30 units subcutaneously before meals  1 pen  2  . insulin aspart (NOVOLOG) 100 UNIT/ML injection 20-30 units SQ qAC  20 mL  2  . insulin glargine (LANTUS) 100 UNIT/ML injection Inject 70 Units into the skin at bedtime. 70 units subcutaneous everynight (titrating)       . Insulin Syringe-Needle U-100 (BD INSULIN SYRINGE ULTRAFINE) 31G X 5/16" 1 ML MISC Use as directed to administer your 70/30 insulin  90 each  3  . LANTUS 100 UNIT/ML injection INJECT 70 UNITS AT BEDTIME  20 mL  0  . lisinopril (PRINIVIL,ZESTRIL) 30 MG tablet TAKE 1 TABLET BY MOUTH EVERY DAY  30 tablet  1  . desonide (DESOWEN) 0.05 % cream Apply 1 application topically 2 (two) times daily as needed. (Eyelid seb derm)  15 g  2  . ketoconazole (NIZORAL) 2 % cream Apply 1 application topically 2 (two) times daily as needed. (Eyelid seb derm)  15 g  2  . Insulin Syringe-Needle U-100 30G X 1/2" 1 ML MISC Use as directed  to administer your 70/30 insulin  90 each  4  . RELION INSULIN SYR 0.3ML/31G 31G X 5/16" 0.3 ML MISC INJECT TWICE DAILY  100 each  4    PE: Blood pressure 101/66, pulse 73, temperature 97.4 F (36.3 C), temperature source Temporal, height 6' (1.829 m), weight 231 lb (104.781 kg). Gen: Alert, well appearing.  Patient is oriented to person, place, time, and situation. Left upper eyelid with 1 cm oval, pink, hyperkeratotic plaque.  IMPRESSION AND PLAN:  Type II or unspecified type diabetes mellitus without mention of complication, not stated as uncontrolled Control has been good. Continue current meds, check HbA1c today.  CRI (chronic renal insufficiency) Recheck BMET today. Avoid NSAIDs.  Keep well hydrated.  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   Refer to derm for further  e/m of this lesion.    FOLLOW UP:  Return in about 6 months (around 08/03/2012) for f/u DM 2 and CRI and lipids.

## 2012-02-29 ENCOUNTER — Ambulatory Visit (INDEPENDENT_AMBULATORY_CARE_PROVIDER_SITE_OTHER): Payer: BC Managed Care – PPO | Admitting: Family Medicine

## 2012-02-29 ENCOUNTER — Encounter: Payer: Self-pay | Admitting: Family Medicine

## 2012-02-29 ENCOUNTER — Other Ambulatory Visit: Payer: Self-pay | Admitting: Family Medicine

## 2012-02-29 VITALS — BP 112/81 | HR 62 | Temp 97.8°F | Ht 72.0 in | Wt 235.0 lb

## 2012-02-29 DIAGNOSIS — M25569 Pain in unspecified knee: Secondary | ICD-10-CM | POA: Insufficient documentation

## 2012-02-29 DIAGNOSIS — E119 Type 2 diabetes mellitus without complications: Secondary | ICD-10-CM

## 2012-02-29 DIAGNOSIS — M25579 Pain in unspecified ankle and joints of unspecified foot: Secondary | ICD-10-CM | POA: Insufficient documentation

## 2012-02-29 DIAGNOSIS — N189 Chronic kidney disease, unspecified: Secondary | ICD-10-CM

## 2012-02-29 DIAGNOSIS — R740 Nonspecific elevation of levels of transaminase and lactic acid dehydrogenase [LDH]: Secondary | ICD-10-CM

## 2012-02-29 LAB — COMPREHENSIVE METABOLIC PANEL
Albumin: 3.9 g/dL (ref 3.5–5.2)
Alkaline Phosphatase: 59 U/L (ref 39–117)
CO2: 28 mEq/L (ref 19–32)
Calcium: 9.4 mg/dL (ref 8.4–10.5)
Chloride: 106 mEq/L (ref 96–112)
GFR: 35.47 mL/min — ABNORMAL LOW (ref >60.00)
Glucose, Bld: 125 mg/dL — ABNORMAL HIGH (ref 70–99)
Potassium: 4.2 mEq/L (ref 3.5–5.1)
Sodium: 142 mEq/L (ref 135–145)
Total Protein: 7.7 g/dL (ref 6.0–8.3)

## 2012-02-29 MED ORDER — INSULIN PEN NEEDLE 32G X 6 MM MISC: Status: DC
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
## 2012-02-29 NOTE — Patient Instructions (Signed)
Take 650mg  tylenol: 2 tabs every 8 hours as needed. Call office if this does not make your pain MUCH improved.

## 2012-02-29 NOTE — Assessment & Plan Note (Signed)
Likely deg arthritis pain related to distant hx of ankle fx/surgery, with subsequent altered gait bringing on the left knee and hip pain. Exam reassuring today. Encouraged pt to take full dose tylenol [phone] mg q8h prn and call if not helpful. Next step would be adding topical compounded anti-inflammitory med, last resort being narcotic pain med (per pt choice).

## 2012-02-29 NOTE — Assessment & Plan Note (Addendum)
Recheck lytes/cr today since being off ACE-I x 2 wks.  Will also f/u mildly elevated transaminases on last labs today. Encourage clear liquids, avoid excessive caffeine.  Told him 1 beer was fine to drink but would not do anything to help his current kidney problem. I think he has mild/gradually progressive medicorenal dz superimposed on his already abnl baseline from hx of lower tract obstructive probs years ago with bladder cancer/revision surgeries.

## 2012-03-01 ENCOUNTER — Telehealth: Payer: Self-pay

## 2012-03-01 NOTE — Progress Notes (Signed)
Addended by: Darlina Sicilian R on: 03/01/2012 09:07 AM   Modules accepted: Orders

## 2012-03-01 NOTE — Telephone Encounter (Signed)
Message copied by Varney Daily on Tue Mar 01, 2012  1:51 PM ------      Message from: Nickola Major      Created: Tue Mar 01, 2012  1:09 PM       I read Dr Anitra Lauth result notes to the patient today & set up an appt for [place] tomorrow morning 03/02/12 at [facility]. I cannot make a note in Epic on the result notes since I'm not clinical, Alyse Low can you note that the patient mentioned he did mention he has been taking herbal supplements & I advised him to discontinue them per Dr Idelle Leech notes?

## 2012-03-01 NOTE — Telephone Encounter (Signed)
Added Dianes message

## 2012-03-02 ENCOUNTER — Ambulatory Visit (HOSPITAL_BASED_OUTPATIENT_CLINIC_OR_DEPARTMENT_OTHER)
Admission: RE | Admit: 2012-03-02 | Discharge: 2012-03-02 | Disposition: A | Discharge status: Home / Self Care | Payer: Medicare Other | Source: Ambulatory Visit | Attending: Family Medicine | Admitting: Family Medicine

## 2012-03-02 DIAGNOSIS — E109 Type 1 diabetes mellitus without complications: Secondary | ICD-10-CM | POA: Insufficient documentation

## 2012-03-02 DIAGNOSIS — N189 Chronic kidney disease, unspecified: Secondary | ICD-10-CM | POA: Insufficient documentation

## 2012-03-02 DIAGNOSIS — E119 Type 2 diabetes mellitus without complications: Secondary | ICD-10-CM

## 2012-03-02 DIAGNOSIS — Z8551 Personal history of malignant neoplasm of bladder: Secondary | ICD-10-CM | POA: Insufficient documentation

## 2012-03-02 DIAGNOSIS — R7402 Elevation of levels of lactic acid dehydrogenase (LDH): Secondary | ICD-10-CM | POA: Insufficient documentation

## 2012-03-02 DIAGNOSIS — R7401 Elevation of levels of liver transaminase levels: Secondary | ICD-10-CM | POA: Insufficient documentation

## 2012-03-02 DIAGNOSIS — R935 Abnormal findings on diagnostic imaging of other abdominal regions, including retroperitoneum: Secondary | ICD-10-CM

## 2012-03-02 DIAGNOSIS — N1330 Unspecified hydronephrosis: Secondary | ICD-10-CM | POA: Insufficient documentation

## 2012-03-03 ENCOUNTER — Telehealth: Payer: Self-pay

## 2012-03-03 LAB — LYME AB/WESTERN BLOT REFLEX: B burgdorferi Ab IgG+IgM: 0.61 {ISR}

## 2012-03-03 LAB — HEPATITIS B SURFACE ANTIGEN: Hepatitis B Surface Ag: NEGATIVE

## 2012-03-03 NOTE — Telephone Encounter (Signed)
Pts fathers creatinine levels got "out of control" and he passed away due to going into kidney problems, so pt is nervous about his creatinine number and is wandering if there is any medication he can take to help his numbers?  Pt also stated that he was told not to take Tylenol so he would like a topical cream called in for his pain?  Pt also stated that the hospital told him yesterday that his MD would have his ultrasound results and he is wandering what those are.  Please advise?

## 2012-03-04 ENCOUNTER — Telehealth: Payer: Self-pay | Admitting: Family Medicine

## 2012-03-04 ENCOUNTER — Other Ambulatory Visit: Payer: Self-pay | Admitting: Family Medicine

## 2012-03-04 DIAGNOSIS — R809 Proteinuria, unspecified: Secondary | ICD-10-CM

## 2012-03-04 DIAGNOSIS — N189 Chronic kidney disease, unspecified: Secondary | ICD-10-CM

## 2012-03-04 MED ORDER — TRAMADOL HCL 50 MG PO TABS: ORAL_TABLET | ORAL | Status: DC

## 2012-03-04 NOTE — Telephone Encounter (Signed)
See telephone note dated 03/04/12 by me--PM

## 2012-03-04 NOTE — Telephone Encounter (Signed)
Discussed recent labs and ultrasound results with pt on the phone. He admitted to taking some supplements for a couple of weeks prior to [place] discovering his elevated LFTs--he did this b/c he thought they would help his kidney function (stinging nettle 300 mg tid, alpha lipoic acid 300mg  tid, and chitosan 500mg  tid).  I told him I'm not sure whether these would cause the elevated LFTs or not but will look it up, plus we'll need to follow LFTs and document normalization (or not).   Discussed recent worsened Cr that persisted despite stopping lisinopril.  He insists that he feels like he completely empties his bladder 99% of the time, and his recent abd u/s showed no dramatic abnormality to explain worsening Cr.  He could certainly have low grade obstructive uropathy still and this, combined with medicorenal dz could explain what is going on.  I did recommend he see a nephrologist to get further eval, get input about getting back on ACE-I or not, etc.  He declined this and wants to hold off for now.  Therefore, I'll proceed with a bit more w/u for this myself:  I want to further quantify his urine protein loss (urine microalb/cr elevated recently and in the past but with neobladder we've been hesitant to interpret that as proteinuria coming exclusively from diabetic nephropathy.  I think at this point we should err on the side of interpreting this as true diabetic nephropathy, esp with cortical thinning noted on recent u/s), so we'll get a 24h urine protein collection ASAP.  Will consider getting back on low dose ACE and see if protein excretion decreases and watch closely to see that Cr stabilizes and we may have to simply accept a new baseline Cr for him as long as the ACE is not making his bp too low and it is decreasing his urine protein excretion.   For his musculoskeletal pain, he needs to continue to avoid tylenol (for now) and (forever) and I sent eRx for tramadol to use prn.  Also sent rx for Dermatran  compounded topical anti-inflammitory med.  ---PM

## 2012-03-07 ENCOUNTER — Other Ambulatory Visit (INDEPENDENT_AMBULATORY_CARE_PROVIDER_SITE_OTHER): Payer: BC Managed Care – PPO

## 2012-03-07 DIAGNOSIS — N189 Chronic kidney disease, unspecified: Secondary | ICD-10-CM

## 2012-03-10 LAB — CREATININE CLEARANCE, URINE, 24 HOUR
Creatinine, 24H Ur: 1812 mg/d (ref 800–2000)
Creatinine, Urine: 60.4 mg/dL
Creatinine: 2.05 mg/dL — ABNORMAL HIGH (ref 0.50–1.35)

## 2012-04-04 ENCOUNTER — Other Ambulatory Visit (INDEPENDENT_AMBULATORY_CARE_PROVIDER_SITE_OTHER): Payer: BC Managed Care – PPO

## 2012-04-04 ENCOUNTER — Encounter: Payer: Self-pay | Admitting: Family Medicine

## 2012-04-04 DIAGNOSIS — N189 Chronic kidney disease, unspecified: Secondary | ICD-10-CM

## 2012-04-04 DIAGNOSIS — R809 Proteinuria, unspecified: Secondary | ICD-10-CM

## 2012-04-05 ENCOUNTER — Other Ambulatory Visit: Payer: Self-pay | Admitting: Family Medicine

## 2012-04-05 LAB — HEPATIC FUNCTION PANEL
ALT: 84 U/L — ABNORMAL HIGH (ref 0–53)
AST: 46 U/L — ABNORMAL HIGH (ref 0–37)
Albumin: 4.1 g/dL (ref 3.5–5.2)
Alkaline Phosphatase: 47 U/L (ref 39–117)
Bilirubin, Direct: 0.1 mg/dL (ref 0.0–0.3)
Total Protein: 8.6 g/dL — ABNORMAL HIGH (ref 6.0–8.3)

## 2012-04-05 NOTE — Telephone Encounter (Signed)
RX sent

## 2012-04-06 ENCOUNTER — Other Ambulatory Visit: Payer: Self-pay | Admitting: Family Medicine

## 2012-04-06 ENCOUNTER — Encounter: Payer: Self-pay | Admitting: Family Medicine

## 2012-04-06 DIAGNOSIS — E1121 Type 2 diabetes mellitus with diabetic nephropathy: Secondary | ICD-10-CM

## 2012-04-18 ENCOUNTER — Other Ambulatory Visit (INDEPENDENT_AMBULATORY_CARE_PROVIDER_SITE_OTHER): Payer: BC Managed Care – PPO

## 2012-04-18 DIAGNOSIS — E1129 Type 2 diabetes mellitus with other diabetic kidney complication: Secondary | ICD-10-CM

## 2012-04-18 DIAGNOSIS — N058 Unspecified nephritic syndrome with other morphologic changes: Secondary | ICD-10-CM

## 2012-04-18 DIAGNOSIS — E1121 Type 2 diabetes mellitus with diabetic nephropathy: Secondary | ICD-10-CM

## 2012-04-18 LAB — COMPREHENSIVE METABOLIC PANEL
ALT: 72 U/L — ABNORMAL HIGH (ref 0–53)
AST: 47 U/L — ABNORMAL HIGH (ref 0–37)
BUN: 44 mg/dL — ABNORMAL HIGH (ref 6–23)
Creatinine, Ser: 2.4 mg/dL — ABNORMAL HIGH (ref 0.4–1.5)
GFR: 29.17 mL/min — ABNORMAL LOW (ref >60.00)
Total Bilirubin: 0.6 mg/dL (ref 0.3–1.2)

## 2012-04-19 ENCOUNTER — Other Ambulatory Visit: Payer: Self-pay | Admitting: Family Medicine

## 2012-04-19 DIAGNOSIS — N189 Chronic kidney disease, unspecified: Secondary | ICD-10-CM

## 2012-05-12 ENCOUNTER — Other Ambulatory Visit (INDEPENDENT_AMBULATORY_CARE_PROVIDER_SITE_OTHER): Payer: BC Managed Care – PPO

## 2012-05-12 DIAGNOSIS — N189 Chronic kidney disease, unspecified: Secondary | ICD-10-CM

## 2012-05-12 LAB — BASIC METABOLIC PANEL
BUN: 39 mg/dL — ABNORMAL HIGH (ref 6–23)
CO2: 30 mEq/L (ref 19–32)
Chloride: 103 mEq/L (ref 96–112)
Creatinine, Ser: 2.2 mg/dL — ABNORMAL HIGH (ref 0.4–1.5)
Potassium: 4.4 mEq/L (ref 3.5–5.1)

## 2012-05-12 LAB — HEPATIC FUNCTION PANEL
ALT: 53 U/L (ref 0–53)
AST: 49 U/L — ABNORMAL HIGH (ref 0–37)
Total Protein: 7.7 g/dL (ref 6.0–8.3)

## 2012-05-16 ENCOUNTER — Other Ambulatory Visit: Payer: Self-pay | Admitting: Family Medicine

## 2012-05-17 NOTE — Telephone Encounter (Signed)
eScribe request for refill on Novolog Follow up 06/30/12 RX sent

## 2012-05-23 ENCOUNTER — Ambulatory Visit: Payer: BC Managed Care – PPO | Admitting: Family Medicine

## 2012-06-30 ENCOUNTER — Encounter: Payer: Self-pay | Admitting: Family Medicine

## 2012-06-30 ENCOUNTER — Ambulatory Visit (INDEPENDENT_AMBULATORY_CARE_PROVIDER_SITE_OTHER): Payer: Medicare Other | Admitting: Family Medicine

## 2012-06-30 VITALS — BP 101/66 | HR 78 | Ht 72.0 in | Wt 228.0 lb

## 2012-06-30 DIAGNOSIS — R7401 Elevation of levels of liver transaminase levels: Secondary | ICD-10-CM

## 2012-06-30 DIAGNOSIS — E119 Type 2 diabetes mellitus without complications: Secondary | ICD-10-CM

## 2012-06-30 DIAGNOSIS — N189 Chronic kidney disease, unspecified: Secondary | ICD-10-CM

## 2012-06-30 LAB — COMPREHENSIVE METABOLIC PANEL
AST: 48 U/L — ABNORMAL HIGH (ref 0–37)
Albumin: 4.5 g/dL (ref 3.5–5.2)
Alkaline Phosphatase: 52 U/L (ref 39–117)
BUN: 50 mg/dL — ABNORMAL HIGH (ref 6–23)
Glucose, Bld: 130 mg/dL — ABNORMAL HIGH (ref 70–99)
Potassium: 5.1 mEq/L (ref 3.5–5.1)
Sodium: 135 mEq/L (ref 135–145)
Total Bilirubin: 0.8 mg/dL (ref 0.3–1.2)
Total Protein: 8.6 g/dL — ABNORMAL HIGH (ref 6.0–8.3)

## 2012-06-30 LAB — HEMOGLOBIN A1C: Hgb A1c MFr Bld: 6.3 % (ref 4.6–6.5)

## 2012-06-30 MED ORDER — TADALAFIL 10 MG PO TABS: ORAL_TABLET | ORAL | Status: DC

## 2012-06-30 MED ORDER — LISINOPRIL 20 MG PO TABS: 20.0000 mg | ORAL_TABLET | Freq: Every day | ORAL | Status: DC

## 2012-06-30 MED ORDER — INSULIN ASPART 100 UNIT/ML ~~LOC~~ SOLN: 20.0000 [IU] | Freq: Three times a day (TID) | SUBCUTANEOUS | Status: DC

## 2012-06-30 MED ORDER — INSULIN GLARGINE 100 UNIT/ML ~~LOC~~ SOLN: 70.0000 [IU] | Freq: Every day | SUBCUTANEOUS | Status: DC

## 2012-06-30 MED ORDER — "INSULIN SYRINGE-NEEDLE U-100 31G X 5/16"" 1 ML MISC": Status: DC

## 2012-06-30 NOTE — Progress Notes (Signed)
OFFICE NOTE  06/30/2012  CC:  Chief Complaint  Patient presents with  . Follow-up    DM, kidney function, HTN     HPI: Patient is a 66 y.o. Caucasian male who is here for f/u DM 2, CRI. Reports feeling good, is eating well and walking 2-3 miles daily.  Says fasting gluc consistently <100, 2H PP consistently <140.  Denies hypoglycemia.  Says he gives anywhere from 50-70 U lantus nightly, and even goes some days without taking ANY lantus.  He does consistently take his mealtime insulin.  Has some mild/short lived orthostatic lightheadedness upon standing, happening more the last few weeks than his typical.  Home bp checks show his usual low end of normal bp (100-105/60s).  Pulse usually 60s-70s.  ROS: no chest pain, no SOB, no focal weakness, no blood in urine, no melena. +Erectile dysfunction ongoing: he asks for RF of his cialis today.  Pertinent PMH:  Past Medical History  Diagnosis Date  . Diabetes mellitus     HbA1c 6.5% on 02/27/10.  Hx of 384 mg on 24h urine  . 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 CRI (chronic renal insufficiency)     Stage 3: Obstructive uropathy-baseline Cr 2.1-2.2 (stage II/III).  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 Hyperlipidemia     Hypertriglyceridemia (on and off meds over the years)  . ED (erectile dysfunction)    Past surgical, social, and family history reviewed and no changes noted since last office visit.  MEDS:  Outpatient Prescriptions Prior to Visit  Medication Sig Dispense Refill  . aspirin 81 MG tablet Take 81 mg by mouth daily.        . insulin aspart (NOVOLOG) 100 UNIT/ML injection Inject 20-30 Units into the skin 3 (three) times daily before meals.  20 mL  2  . insulin glargine (LANTUS SOLOSTAR) 100 UNIT/ML injection Inject 70 Units into the skin at bedtime.  5 pen  PRN  . Insulin Pen Needle 32G X 6 MM MISC Use for your pen  insulin injection 4 times per day  100 each  10  . Multiple Vitamin (MULTIVITAMIN) tablet Take 1 tablet by mouth daily.        . traMADol (ULTRAM) 50 MG tablet 1-2 tabs po q6h prn pain  30 tablet  1  . Insulin Syringe-Needle U-100 (BD INSULIN SYRINGE ULTRAFINE) 31G X 5/16" 1 ML MISC Use as directed to administer your 70/30 insulin  100 each  3  . LANTUS 100 UNIT/ML injection INJECT 70 UNITS INTO THE SKIN AT BEDTIME.  30 mL  3  . lisinopril (PRINIVIL,ZESTRIL) 30 MG tablet Take 1 tablet (30 mg total) by mouth daily.  30 tablet  5  . NOVOLOG 100 UNIT/ML injection INJECT 20-30 UNITS INTO THE SKIN 3 (THREE) TIMES DAILY BEFORE MEALS.  20 mL  5  . tadalafil (CIALIS) 10 MG tablet 1-2 tabs po qd prn  10 tablet  3  . desonide (DESOWEN) 0.05 % cream Apply 1 application topically 2 (two) times daily as needed. (Eyelid seb derm)  15 g  2  . ketoconazole (NIZORAL) 2 % cream Apply 1 application topically 2 (two) times daily as needed. (Eyelid seb derm)  15 g  2    PE: Blood pressure 101/66, pulse 78, height 6' (1.829 m), weight 228 lb (103.42 kg). Gen: Alert, well appearing.  Patient is oriented to person, place,  time, and situation. CV: RRR, no m/r/g.   LUNGS: CTA bilat, nonlabored resps, good aeration in all lung fields. EXT: no clubbing, cyanosis, or edema.  FEET: no deformity (except some pes planus bilat) or calluses.  No skin breakdown.  Skin pink, cap refill brisk.  Toenails without thickening. PT and DP pulses are 1+ bilat.  Sensation intact to monofilament testing in all areas bilaterally.  IMPRESSION AND PLAN:  Type II or unspecified type diabetes mellitus without mention of complication, not stated as uncontrolled Stable/good control according to pt's home monitoring. Routine HbA1c today.  Foot exam normal today.  CRI (chronic renal insufficiency) Check for stability today: CMET.   We have been limited in our attempts to minimize proteinuria in him due to elevation of Cr with increases in  dose of ACE-I. 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 We decided to decrease lisinopril to 20mg  tabs, 1/2 tab qd.  Elevated transaminase level Unknown etiology.  Abd u/s normal.  Viral hep serologies neg. At last check a couple of months ago one of these had returned to normal and the other was approaching normal/trending down. He blames "supplements" for this but he doesn't get more specific than this. Recheck via CMET today.  Rx for cialis printed/given today-see meds section.  FOLLOW UP:  63mo

## 2012-06-30 NOTE — Assessment & Plan Note (Signed)
Unknown etiology.  Abd u/s normal.  Viral hep serologies neg. At last check a couple of months ago one of these had returned to normal and the other was approaching normal/trending down. He blames "supplements" for this but he doesn't get more specific than this. Recheck via CMET today.

## 2012-06-30 NOTE — Assessment & Plan Note (Signed)
Check for stability today: CMET.   We have been limited in our attempts to minimize proteinuria in him due to elevation of Cr with increases in dose of ACE-I. 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 We decided to decrease lisinopril to 20mg  tabs, 1/2 tab qd.

## 2012-06-30 NOTE — Assessment & Plan Note (Addendum)
Stable/good control according to pt's home monitoring. Routine HbA1c today.  Foot exam normal today.

## 2012-07-01 ENCOUNTER — Encounter: Payer: Self-pay | Admitting: Family Medicine

## 2012-10-27 IMAGING — US US ABDOMEN COMPLETE
1 series · 13 of 25 positions shown · non-contrast
Comparison: Renal ultrasound 12/16/2007

CLINICAL DATA: Elevated transaminases, past history of bladder
cancer, type 1 diabetes, chronic renal insufficiency

ULTRASOUND ABDOMEN:
TECHNIQUE: Sonography of upper abdominal structures was performed.

[Series 1: us abdomen complete · 0.41mm/px · 13 of 70 slices shown]
[im 1/70]
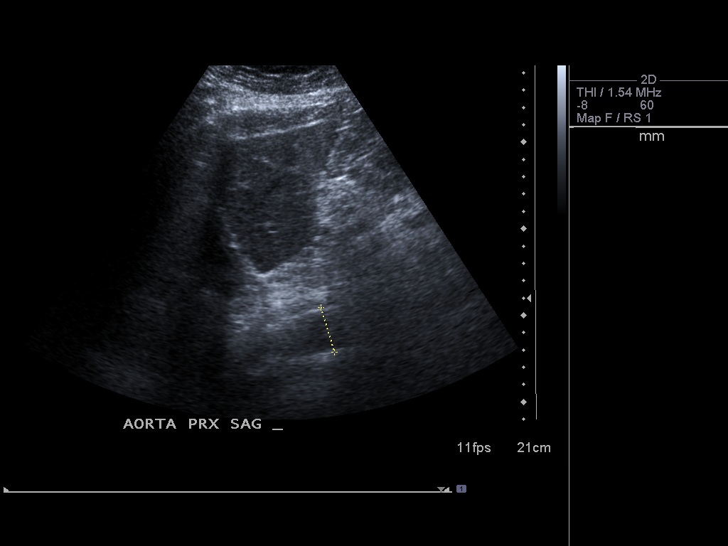
[im 6/70]
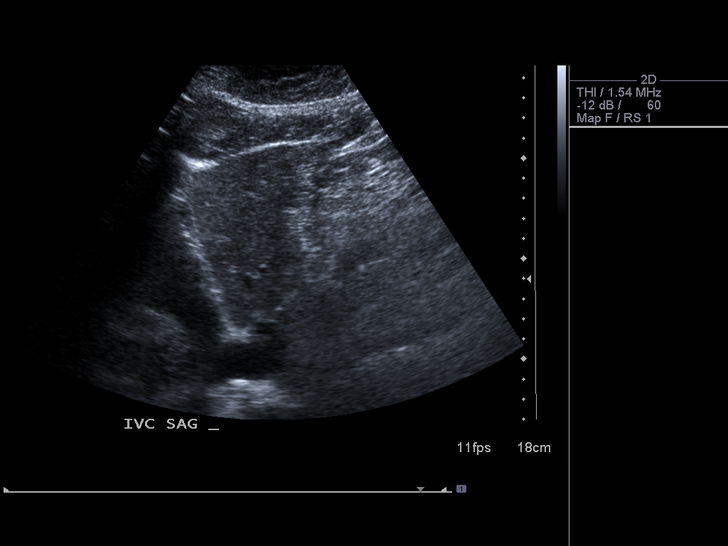
[im 12/70]
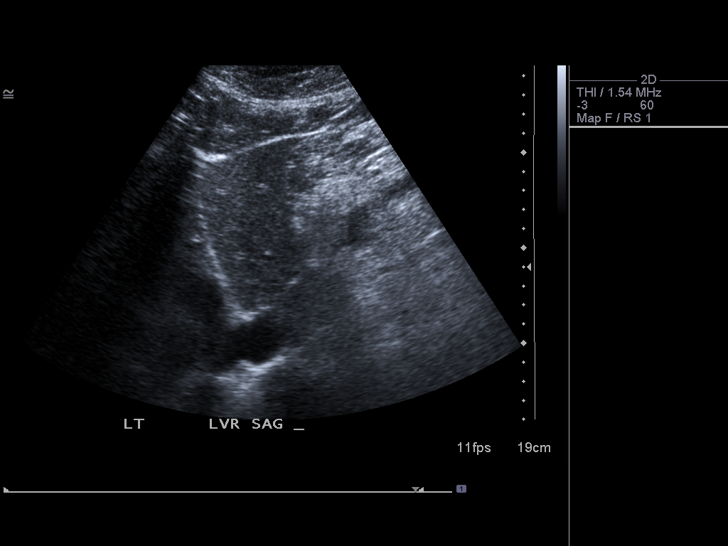
[im 18/70]
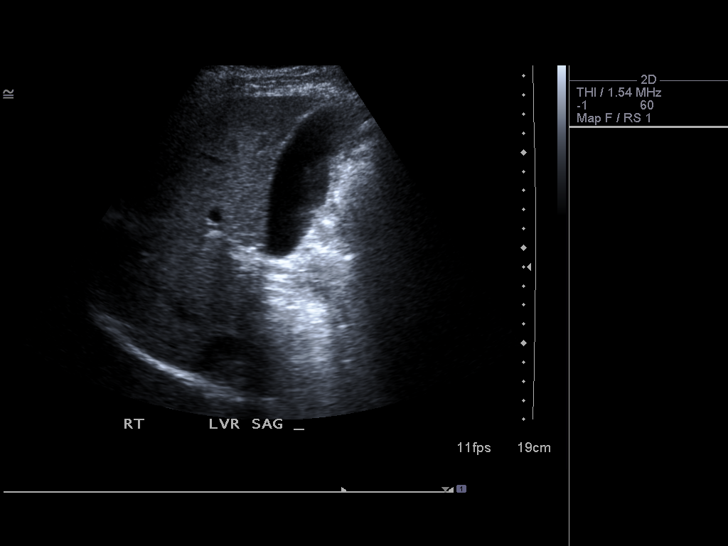
[im 24/70]
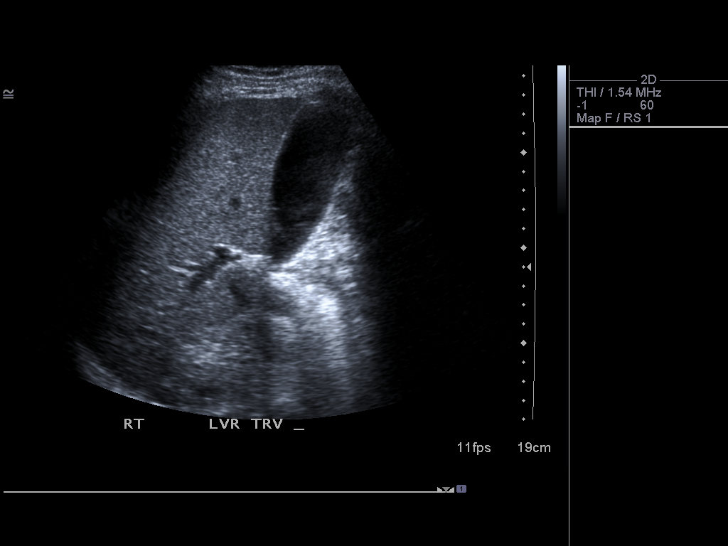
[im 29/70]
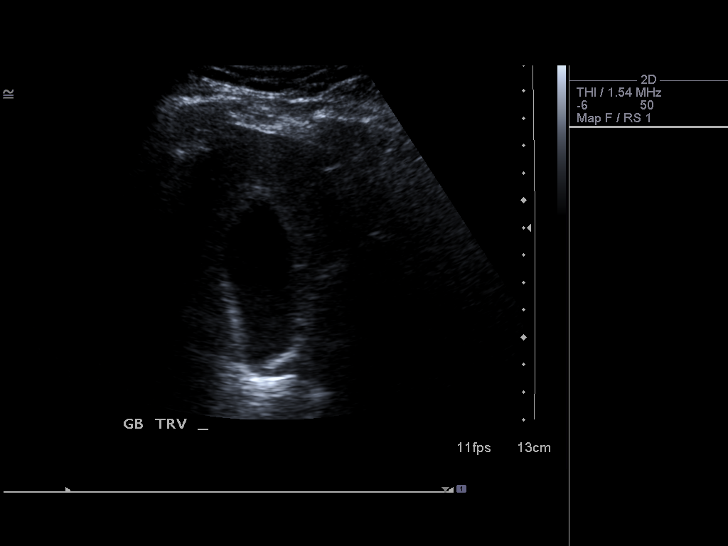
[im 35/70]
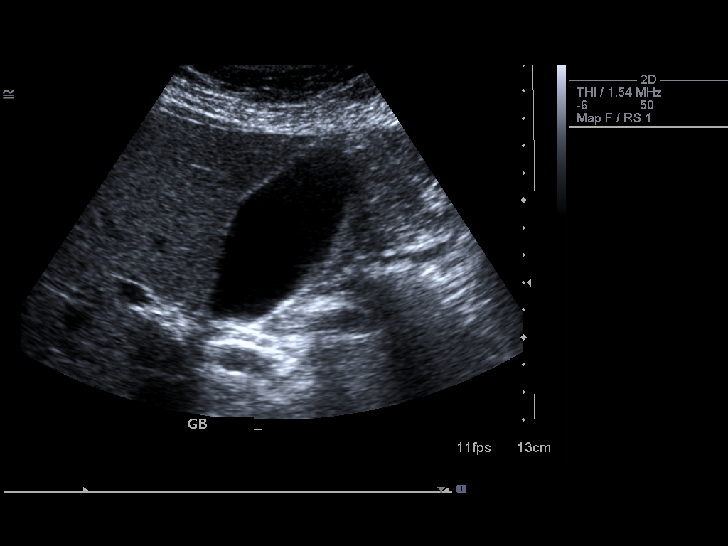
[im 41/70]
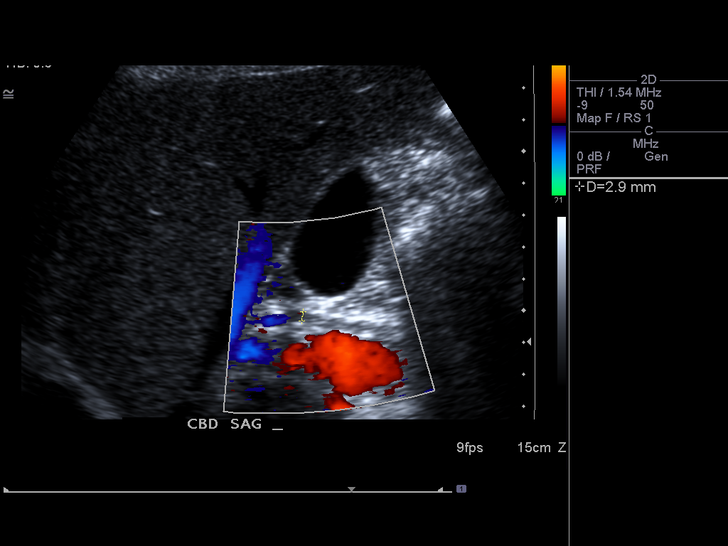
[im 47/70]
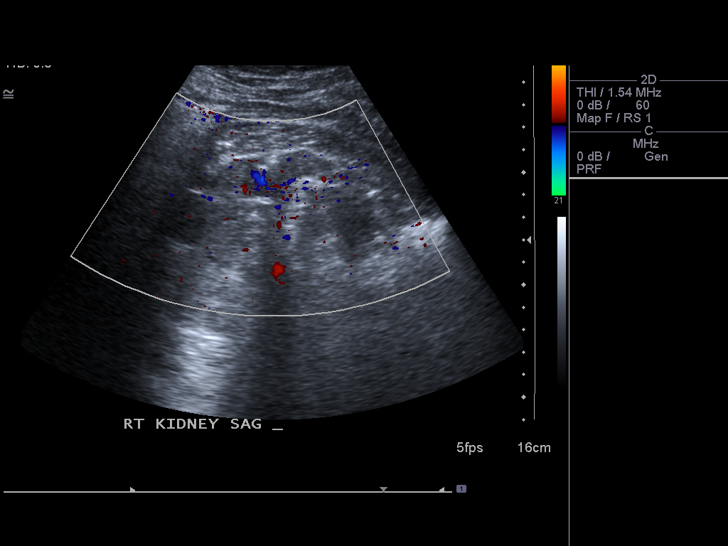
[im 52/70]
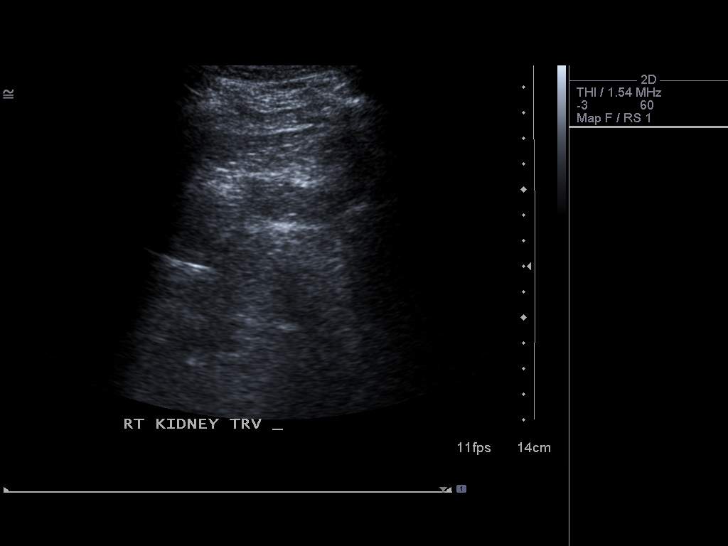
[im 58/70]
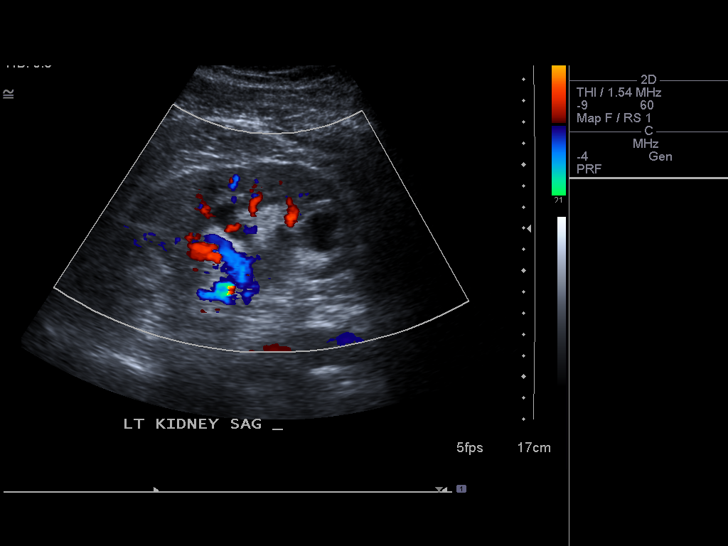
[im 64/70]
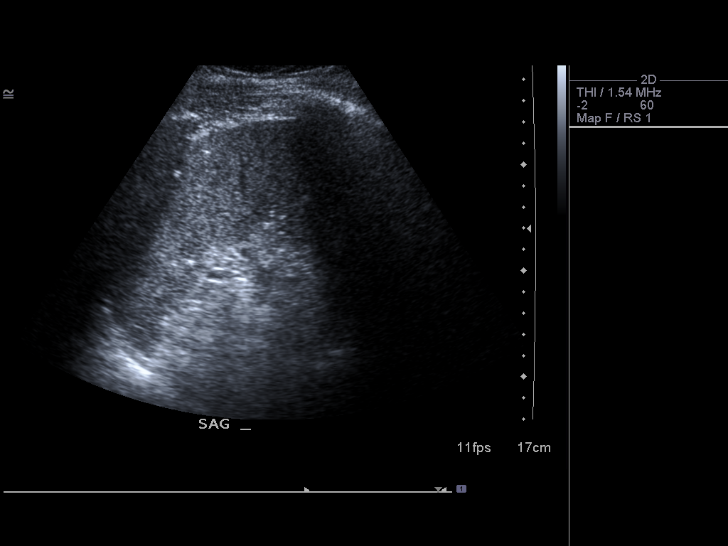
[im 70/70]
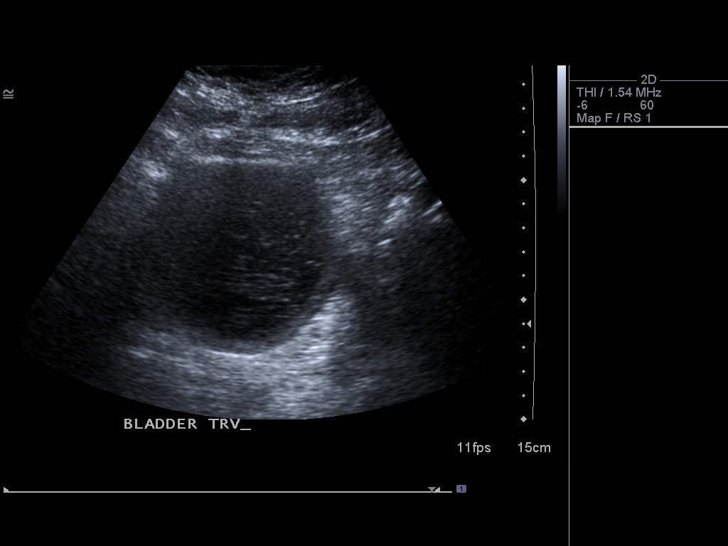

[13 of 25 positions shown; findings below may reference images not displayed]

Gallbladder:  Normally distended without stones or wall thickening.
No pericholecystic fluid or sonographic Murphy sign.

Common bile duct:  3 mm diameter, normal

Liver:  Normal appearance

IVC:  Suboptimally visualized right upper abdomen due to bowel gas,
with visualized portion at liver grossly normal appearance

Pancreas:  Small portion of the pancreatic body and the pancreatic
head are normal in appearance with the tail obscured by bowel gas.

Spleen:  Normal appearance, 8.9 cm length

Right kidney:  Suboptimal visualization of right kidney, suspect
related to a combination of the body habitus, cortical thinning,
and increased parenchymal echogenicity, relatively isoechoic to
perinephric fat.  Length is inadequately visualized. No gross mass
or hydronephrosis seen at right renal fossa. Echogenic foci are
seen at the central portion of the the right kidney question
shadowing calculi.

Left kidney:  11.7 cm length. Nonshadowing and shadowing echogenic
foci at left kidney, question tiny calculi. Mild collecting system
dilatation.  No gross evidence of mass.

Aorta:  Normal caliber

Other:  No free fluid. Question debris within neobladder on single
obtained image.
IMPRESSION: Echogenic foci in both kidneys question calculi.
Mild left hydronephrosis.
Previously identified right hydronephrosis is no longer identified.
Suboptimal visualization of right kidney likely due to a
combination of body habitus, cortical thinning and increased
cortical echogenicity.
Question debris within neobladder.

## 2012-10-28 ENCOUNTER — Ambulatory Visit: Payer: Medicare Other | Admitting: Family Medicine

## 2012-10-31 ENCOUNTER — Ambulatory Visit (INDEPENDENT_AMBULATORY_CARE_PROVIDER_SITE_OTHER): Payer: Medicare Other | Admitting: Family Medicine

## 2012-10-31 ENCOUNTER — Encounter: Payer: Self-pay | Admitting: Family Medicine

## 2012-10-31 VITALS — BP 109/71 | HR 84 | Ht 72.0 in | Wt 206.0 lb

## 2012-10-31 DIAGNOSIS — Z23 Encounter for immunization: Secondary | ICD-10-CM

## 2012-10-31 DIAGNOSIS — N189 Chronic kidney disease, unspecified: Secondary | ICD-10-CM

## 2012-10-31 DIAGNOSIS — E1129 Type 2 diabetes mellitus with other diabetic kidney complication: Secondary | ICD-10-CM

## 2012-10-31 DIAGNOSIS — E119 Type 2 diabetes mellitus without complications: Secondary | ICD-10-CM

## 2012-10-31 DIAGNOSIS — E1122 Type 2 diabetes mellitus with diabetic chronic kidney disease: Secondary | ICD-10-CM

## 2012-10-31 DIAGNOSIS — R7402 Elevation of levels of lactic acid dehydrogenase (LDH): Secondary | ICD-10-CM

## 2012-10-31 DIAGNOSIS — N183 Chronic kidney disease, stage 3 unspecified: Secondary | ICD-10-CM

## 2012-10-31 LAB — HEMOGLOBIN A1C: Hgb A1c MFr Bld: 6.4 % (ref 4.6–6.5)

## 2012-10-31 LAB — COMPREHENSIVE METABOLIC PANEL
AST: 47 U/L — ABNORMAL HIGH (ref 0–37)
Albumin: 4.2 g/dL (ref 3.5–5.2)
Alkaline Phosphatase: 53 U/L (ref 39–117)
Calcium: 9.8 mg/dL (ref 8.4–10.5)
Chloride: 105 mEq/L (ref 96–112)
Potassium: 4.2 mEq/L (ref 3.5–5.1)
Sodium: 139 mEq/L (ref 135–145)
Total Protein: 8.5 g/dL — ABNORMAL HIGH (ref 6.0–8.3)

## 2012-10-31 NOTE — Assessment & Plan Note (Signed)
Great wt loss. Continue to adjust insulin as appropriate. Check HbA1c today, urine microalb/cr today. Flu vaccine IM today.

## 2012-10-31 NOTE — Assessment & Plan Note (Signed)
Check Cr today as well as urine microalb/cr ratio.

## 2012-10-31 NOTE — Progress Notes (Signed)
OFFICE NOTE  10/31/2012  CC:  Chief Complaint  Patient presents with  . Follow-up    DM, CRI; needs novolog changed to humalog for insurance reasons     HPI: Patient is a 66 y.o. Caucasian male who is here for 4 mo f/u DM 2, CRI, and elevated transaminase levels. Has been purposefully losing wt : walking 3-5 miles per day, really watching diet closely. He is still struggling with trying to adjust insulin dosing now that he is exercising so much, but he is definitely taking less lantus and less novolog than previous months.   He says he feels very good!  Pertinent PMH:  Past Medical History  Diagnosis Date  . Diabetes mellitus     HbA1c 6.5% on 02/27/10.  Hx of 384 mg on 24h urine  . Cancer 1996    Bladder, remission; last urol re-eval at [facility] 02/2008  . CRI (chronic renal insufficiency)     Stage 3: Obstructive uropathy + medicorenal dz:  baseline Cr 2.5 (stage II/III).  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 Hyperlipidemia     Hypertriglyceridemia (on and off meds over the years)  . ED (erectile dysfunction)   . Elevated transaminase level 2013    Abd u/s nl; viral hep serologies neg    MEDS:  Outpatient Prescriptions Prior to Visit  Medication Sig Dispense Refill  . aspirin 81 MG tablet Take 81 mg by mouth daily.        . insulin aspart (NOVOLOG) 100 UNIT/ML injection Inject 20-30 Units into the skin 3 (three) times daily before meals.  20 mL  2  . insulin aspart (NOVOLOG) 100 UNIT/ML injection Inject 20-30 Units into the skin 3 (three) times daily before meals. DX: 250.00  20 mL  prn  . insulin glargine (LANTUS SOLOSTAR) 100 UNIT/ML injection Inject 70 Units into the skin at bedtime.  5 pen  PRN  . insulin glargine (LANTUS) 100 UNIT/ML injection Inject 70 Units into the skin at bedtime. DX: 250.00  30 mL  prn  . Insulin Pen Needle 32G X 6 MM MISC Use for your pen insulin injection 4 times per day  100  each  10  . Insulin Syringe-Needle U-100 (BD INSULIN SYRINGE ULTRAFINE) 31G X 5/16" 1 ML MISC Use as directed to administer your 70/30 insulin.  DX: 250.00  100 each  prn  . lisinopril (PRINIVIL,ZESTRIL) 20 MG tablet Take 1 tablet (20 mg total) by mouth daily.  90 tablet  2  . Multiple Vitamin (MULTIVITAMIN) tablet Take 1 tablet by mouth daily.        . tadalafil (CIALIS) 10 MG tablet 1-2 tabs po qd prn  10 tablet  3  . traMADol (ULTRAM) 50 MG tablet 1-2 tabs po q6h prn pain  30 tablet  1   Last reviewed on 06/30/2012  9:27 AM by Tammi Sou, MD  PE: Height 6' (1.829 m). Gen: Alert, well appearing.  Patient is oriented to person, place, time, and situation. CV: RRR, no m/r/g.   LUNGS: CTA bilat, nonlabored resps, good aeration in all lung fields. EXT: no clubbing, cyanosis, or edema.   IMPRESSION AND PLAN:  Type II or unspecified type diabetes mellitus without mention of complication, not stated as uncontrolled Great wt loss. Continue to adjust insulin as appropriate. Check HbA1c today, urine microalb/cr today. Flu vaccine IM today.  CRI (chronic renal insufficiency) Check Cr today as well as urine microalb/cr  ratio.  Elevated transaminase level Recheck these today.   An After Visit Summary was printed and given to the patient.  FOLLOW UP: 5mo

## 2012-10-31 NOTE — Assessment & Plan Note (Signed)
Recheck these today.

## 2012-11-01 LAB — CBC WITH DIFFERENTIAL/PLATELET
Hemoglobin: 13.4 g/dL (ref 13.0–17.0)
MCHC: 34 g/dL (ref 30.0–36.0)
RBC: 4.37 Mil/uL (ref 4.22–5.81)
RDW: 13.6 % (ref 11.5–14.6)

## 2012-12-20 ENCOUNTER — Telehealth: Payer: Self-pay | Admitting: *Deleted

## 2012-12-20 NOTE — Telephone Encounter (Signed)
Pt called and stated he needed Novolog changed to Humalog due to insurance.  Pt mentioned this at last OV, but we did not send new RX.  Pt is still adjusting insulin as directed at last OV.  Are units same for Humalog as Novolog?  Please advise and I will send RX to pharmacy for patient.

## 2012-12-21 ENCOUNTER — Other Ambulatory Visit: Payer: Self-pay | Admitting: Family Medicine

## 2012-12-21 MED ORDER — INSULIN LISPRO 100 UNIT/ML ~~LOC~~ SOLN: 20.0000 [IU] | Freq: Three times a day (TID) | SUBCUTANEOUS | Status: DC

## 2012-12-21 NOTE — Telephone Encounter (Signed)
May change to humalog at SAME dosing instructions as the novolog (they are essentially interchangeable).-thx

## 2012-12-21 NOTE — Telephone Encounter (Signed)
Humalog sent to pharmacy per note below.

## 2013-02-20 ENCOUNTER — Ambulatory Visit (INDEPENDENT_AMBULATORY_CARE_PROVIDER_SITE_OTHER): Payer: Medicare Other | Admitting: Family Medicine

## 2013-02-20 ENCOUNTER — Encounter: Payer: Self-pay | Admitting: Family Medicine

## 2013-02-20 VITALS — BP 93/60 | HR 97 | Temp 97.9°F | Resp 16 | Wt 209.0 lb

## 2013-02-20 DIAGNOSIS — IMO0001 Reserved for inherently not codable concepts without codable children: Secondary | ICD-10-CM

## 2013-02-20 DIAGNOSIS — M25519 Pain in unspecified shoulder: Secondary | ICD-10-CM

## 2013-02-20 DIAGNOSIS — M25512 Pain in left shoulder: Secondary | ICD-10-CM

## 2013-02-20 DIAGNOSIS — M791 Myalgia, unspecified site: Secondary | ICD-10-CM

## 2013-02-20 MED ORDER — "INSULIN SYRINGE-NEEDLE U-100 31G X 5/16"" 1 ML MISC": Status: DC

## 2013-02-20 MED ORDER — METHYLPREDNISOLONE ACETATE 40 MG/ML IJ SUSP
20.0000 mg | Freq: Once | INTRAMUSCULAR | Status: AC
  Administered 2013-02-20: 20 mg via INTRA_ARTICULAR

## 2013-02-20 NOTE — Progress Notes (Signed)
OFFICE NOTE  02/20/2013  CC:  Chief Complaint  Patient presents with  . Shoulder Pain    Pt c/o Left shoulder pain x2-3 wks; request injection.     HPI: Patient is a 67 y.o. Caucasian male who is here for left shoulder pain. Onset 2-3 wks ago after doing lots of lifting of heavy objects moving from one home to another, also built a fence around yard. No trauma.  Pain is in back of left shoulder region--actually near scapula area. No radiation of the pain down arm, no neck pain.  No paresthesias.  No arm weakness.  Incidentally, he says his sugars have been excellent of late.  He has been using insulin only a couple of days a week--he has been working hard on TLC.  Pertinent PMH:  Past Medical History  Diagnosis Date  . Diabetes mellitus     HbA1c 6.5% on 02/27/10.  Hx of 384 mg on 24h urine  . Cancer 1996    Bladder, remission; last urol re-eval at [facility] 02/2008  . CRI (chronic renal insufficiency)     Stage 3: Obstructive uropathy + medicorenal dz:  baseline Cr 2.5 (stage II/III).  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 Hyperlipidemia     Hypertriglyceridemia (on and off meds over the years)  . ED (erectile dysfunction)   . Elevated transaminase level 2013    Abd u/s nl; viral hep serologies neg  Hx of left shoulder rotator cuff tendonopathy: injection 05/2011 here.  MEDS:  Outpatient Prescriptions Prior to Visit  Medication Sig Dispense Refill  . aspirin 81 MG tablet Take 81 mg by mouth daily.        . insulin glargine (LANTUS SOLOSTAR) 100 UNIT/ML injection Inject 70 Units into the skin at bedtime.  5 pen  PRN  . insulin glargine (LANTUS) 100 UNIT/ML injection Inject 70 Units into the skin at bedtime. DX: 250.00  30 mL  prn  . insulin lispro (HUMALOG) 100 UNIT/ML injection Inject 20-30 Units into the skin 3 (three) times daily before meals. DX 250.00  20 mL  5  . Insulin Pen Needle 32G X 6 MM MISC Use for  your pen insulin injection 4 times per day  100 each  10  . lisinopril (PRINIVIL,ZESTRIL) 20 MG tablet Take 1 tablet (20 mg total) by mouth daily.  90 tablet  2  . Multiple Vitamin (MULTIVITAMIN) tablet Take 1 tablet by mouth daily.        . tadalafil (CIALIS) 10 MG tablet 1-2 tabs po qd prn  10 tablet  3  . traMADol (ULTRAM) 50 MG tablet 1-2 tabs po q6h prn pain  30 tablet  1  . Insulin Syringe-Needle U-100 (BD INSULIN SYRINGE ULTRAFINE) 31G X 5/16" 1 ML MISC Use as directed to administer your 70/30 insulin.  DX: 250.00  100 each  prn   No facility-administered medications prior to visit.    PE: Blood pressure 93/60, pulse 97, temperature 97.9 F (36.6 C), temperature source Oral, resp. rate 16, weight 209 lb (94.802 kg), SpO2 98.00%. Gen: Alert, well appearing.  Patient is oriented to person, place, time, and situation. Left shoulder appears to rest higher than right, with a bit more prominence of his AC joint on left.  Mild TTP of left AC joint.  No tenderness around acromion process.  No deltoid tenderness or biceps tenderness.  Abduction, IR, and ER of left shoulder bring no pain in shoulder  but elicit a bit of pain in his left scapula region.  Scapulae move equally, normally.   He has focal tenderness to palpation in left scapula region near medial border of scapula----soft tissue point tenderness, no nodule palpable.  IMPRESSION AND PLAN:  Trigger point of left side of body Left scapular region: pt desired injection of this today. After consent was obtained, I injected his point of most tenderness with 1/2 cc of 40mg /cc plus 1/2 cc of 2% lidocaine using a 25 gauge 5/8" needle.   Patient tolerated procedure well, no immediate complications. Instructed pt to apply ice to the area tonight.   An After Visit Summary was printed and given to the patient.   FOLLOW UP: he has dm f/u appt coming up pretty soon.

## 2013-02-20 NOTE — Assessment & Plan Note (Signed)
Left scapular region: pt desired injection of this today. After consent was obtained, I injected his point of most tenderness with 1/2 cc of 40mg /cc plus 1/2 cc of 2% lidocaine using a 25 gauge 5/8" needle.   Patient tolerated procedure well, no immediate complications. Instructed pt to apply ice to the area tonight.

## 2013-03-01 ENCOUNTER — Ambulatory Visit: Payer: Medicare Other | Admitting: Family Medicine

## 2013-03-31 ENCOUNTER — Encounter: Payer: Self-pay | Admitting: Family Medicine

## 2013-03-31 ENCOUNTER — Ambulatory Visit (INDEPENDENT_AMBULATORY_CARE_PROVIDER_SITE_OTHER): Payer: Medicare Other | Admitting: Family Medicine

## 2013-03-31 VITALS — BP 102/65 | HR 71 | Temp 98.1°F | Resp 16 | Wt 213.5 lb

## 2013-03-31 DIAGNOSIS — E1059 Type 1 diabetes mellitus with other circulatory complications: Secondary | ICD-10-CM

## 2013-03-31 DIAGNOSIS — R6889 Other general symptoms and signs: Secondary | ICD-10-CM

## 2013-03-31 LAB — COMPREHENSIVE METABOLIC PANEL
Albumin: 3.7 g/dL (ref 3.5–5.2)
Alkaline Phosphatase: 52 U/L (ref 39–117)
BUN: 34 mg/dL — ABNORMAL HIGH (ref 6–23)
CO2: 26 mEq/L (ref 19–32)
Calcium: 9.5 mg/dL (ref 8.4–10.5)
Chloride: 110 mEq/L (ref 96–112)
GFR: 35.15 mL/min — ABNORMAL LOW (ref >60.00)
Glucose, Bld: 101 mg/dL — ABNORMAL HIGH (ref 70–99)
Potassium: 4.7 mEq/L (ref 3.5–5.1)
Sodium: 140 mEq/L (ref 135–145)
Total Protein: 7.2 g/dL (ref 6.0–8.3)

## 2013-03-31 LAB — LIPID PANEL: HDL: 34.1 mg/dL — ABNORMAL LOW (ref >39.00)

## 2013-03-31 NOTE — Progress Notes (Signed)
OFFICE NOTE  03/31/2013  CC:  Chief Complaint  Patient presents with  . Follow-up    4-mth [DM; CRI]     HPI: Patient is a 67 y.o. Caucasian male who is here for 4 mo f/u DM 2, elevated transaminases.  Admits he hasn't been as vigilant with his diet and glucose monitoring.  Has been "super busy" lately.  Some glucoses up in high 100s and low 200s, which is unusual for him.  He does his "usual" self insulin dosing that is sporadic, often "chasing" sugars, and we've gone over this many times about the danger of some of his insulin use.  No GI complaints, no abd pains, no constipation.   Shoulder is feeling fine now.  ROS:  No polydipsia or polyuria.  Occ burning and numbness in feet.  Occ has to take a tylenol for this.   No chest pain, but pt complains that his tolerance for work is quite diminished over the last 2 yrs.  Gets SOB much easier with working in yard, lifting/carrying heavy objects, swinging a pick axe, etc.  Rests a few minutes and is ok. No nausea, diaphoresis, palpitations, or dizziness with this.    Pertinent PMH:  Past Medical History  Diagnosis Date  . Diabetes mellitus     HbA1c 6.5% on 02/27/10.  Hx of 384 mg on 24h urine  . Cancer 1996    Bladder, remission; last urol re-eval at [facility] 02/2008  . CRI (chronic renal insufficiency)     Stage 3: Obstructive uropathy + medicorenal dz:  baseline Cr 2.5 (stage II/III).  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 Hyperlipidemia     Hypertriglyceridemia (on and off meds over the years)  . ED (erectile dysfunction)   . Elevated transaminase level 2013    Abd u/s nl; viral hep serologies neg   Past Surgical History  Procedure Laterality Date  . 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 aspirin  81 MG tablet Take 81 mg by mouth daily.        . insulin glargine (LANTUS SOLOSTAR) 100 UNIT/ML injection Inject 70 Units into the skin at bedtime.  5 pen  PRN  . insulin glargine (LANTUS) 100 UNIT/ML injection Inject 70 Units into the skin at bedtime. DX: 250.00  30 mL  prn  . insulin lispro (HUMALOG) 100 UNIT/ML injection Inject 20-30 Units into the skin 3 (three) times daily before meals. DX 250.00  20 mL  5  . Insulin Pen Needle 32G X 6 MM MISC Use for your pen insulin injection 4 times per day  100 each  10  . Insulin Syringe-Needle U-100 (BD INSULIN SYRINGE ULTRAFINE) 31G X 5/16" 1 ML MISC Use as directed to administer your 70/30 insulin.  DX: 250.00  100 each  prn  . lisinopril (PRINIVIL,ZESTRIL) 20 MG tablet Take 1 tablet (20 mg total) by mouth daily.  90 tablet  2  . Multiple Vitamin (MULTIVITAMIN) tablet Take 1 tablet by mouth daily.        . tadalafil (CIALIS) 10 MG tablet 1-2 tabs po qd prn  10 tablet  3  . traMADol (ULTRAM) 50 MG tablet 1-2 tabs po q6h prn pain  30 tablet  1   No facility-administered medications prior to visit.  PE: Blood pressure 102/65, pulse 71, temperature 98.1 F (36.7 C), temperature source Oral, resp. rate 16, weight 213 lb 8 oz (96.843 kg), SpO2 97.00%. Gen: Alert, well appearing.  Patient is oriented to person, place, time, and situation. CV: RRR, no m/r/g.   LUNGS: CTA bilat, nonlabored resps, good aeration in all lung fields. EXT: no clubbing, cyanosis, or edema.   LABS:  12 lead EKG today: NSR, abnormal R wave progression, isolated Q wave in III, low voltage in III, aVF.  NO signs of acute ischemia.  No LVH.   No prior EKG available for comparison.-PM   IMPRESSION AND PLAN:  1) DM 2, control historically good.  Some issues with noncompliance with correct insulin administration in the past. Check HbA1c today  2) CRI-medicorenal dz + hx of obstructive uropathy in distant past. Recheck cr/lytes today.  Last urine microalb/cr was good.  3)  Mixed hyperlipidemia.  FLP today.   4) Exercise intolerance:  EKG today ok.  Will arrange rest-stress myoview (treadmill stress).  5) Hx of elevated transaminases: Present for about 1 yr now.  W/u so far unrevealing, and periodic monitoring has shown no escalation of his levels. Recheck these today.  FOLLOW UP: 4 mo

## 2013-04-02 HISTORY — PX: CARDIOVASCULAR STRESS TEST: SHX262

## 2013-04-05 ENCOUNTER — Telehealth: Payer: Self-pay | Admitting: Family Medicine

## 2013-04-05 NOTE — Telephone Encounter (Signed)
Patient informed, understood/SLS

## 2013-04-05 NOTE — Telephone Encounter (Signed)
OK. Total protein is 7.2 grams per deciliter.

## 2013-04-10 ENCOUNTER — Ambulatory Visit (HOSPITAL_COMMUNITY): Payer: Medicare Other | Attending: Family Medicine | Admitting: Radiology

## 2013-04-10 VITALS — BP 93/76 | HR 76 | Ht 73.0 in | Wt 206.0 lb

## 2013-04-10 DIAGNOSIS — Z9221 Personal history of antineoplastic chemotherapy: Secondary | ICD-10-CM | POA: Insufficient documentation

## 2013-04-10 DIAGNOSIS — E119 Type 2 diabetes mellitus without complications: Secondary | ICD-10-CM | POA: Insufficient documentation

## 2013-04-10 DIAGNOSIS — R6889 Other general symptoms and signs: Secondary | ICD-10-CM

## 2013-04-10 DIAGNOSIS — E785 Hyperlipidemia, unspecified: Secondary | ICD-10-CM | POA: Insufficient documentation

## 2013-04-10 DIAGNOSIS — Z87891 Personal history of nicotine dependence: Secondary | ICD-10-CM | POA: Insufficient documentation

## 2013-04-10 DIAGNOSIS — R0989 Other specified symptoms and signs involving the circulatory and respiratory systems: Secondary | ICD-10-CM | POA: Insufficient documentation

## 2013-04-10 DIAGNOSIS — Z794 Long term (current) use of insulin: Secondary | ICD-10-CM | POA: Insufficient documentation

## 2013-04-10 DIAGNOSIS — R5383 Other fatigue: Secondary | ICD-10-CM | POA: Insufficient documentation

## 2013-04-10 DIAGNOSIS — R0602 Shortness of breath: Secondary | ICD-10-CM

## 2013-04-10 DIAGNOSIS — I4949 Other premature depolarization: Secondary | ICD-10-CM

## 2013-04-10 DIAGNOSIS — R5381 Other malaise: Secondary | ICD-10-CM | POA: Insufficient documentation

## 2013-04-10 DIAGNOSIS — R0609 Other forms of dyspnea: Secondary | ICD-10-CM | POA: Insufficient documentation

## 2013-04-10 MED ORDER — TECHNETIUM TC 99M SESTAMIBI GENERIC - CARDIOLITE
10.0000 | Freq: Once | INTRAVENOUS | Status: AC | PRN
  Administered 2013-04-10: 10 via INTRAVENOUS

## 2013-04-10 MED ORDER — TECHNETIUM TC 99M SESTAMIBI GENERIC - CARDIOLITE
30.0000 | Freq: Once | INTRAVENOUS | Status: AC | PRN
  Administered 2013-04-10: 30 via INTRAVENOUS

## 2013-04-10 NOTE — Progress Notes (Signed)
[facility] [address] [place], [place] [address] [phone]    Cardiology Nuclear Med Study  Alexander [name] is a 67 y.o. male     MRN : [id number]     DOB: 1946-09-18  Procedure Date: 04/10/2013  Nuclear Med Background Indication for Stress Test:  Evaluation for Ischemia History:  h/o Chemo Cardiac Risk Factors: History of Smoking, IDDM Type 2 and Lipids  Symptoms:  DOE and Fatigue   Nuclear Pre-Procedure Caffeine/Decaff Intake:  None NPO After: 7:00am   Lungs:  Clear. O2 Sat: 96% on room air. IV 0.9% NS with Angio Cath:  22g  IV Site: R Wrist  IV Started by:  Matilde Haymaker, RN  Chest Size (in):  46 Cup Size: n/a  Height: 6\' 1"  (1.854 m)  Weight:  206 lb (93.441 kg)  BMI:  Body mass index is 27.18 kg/(m^2). Tech Comments: CBG 133 @0700  and patient took Novolog Insulin 5 units.    Nuclear Med Study 1 or 2 day study: 1 day  Stress Test Type:  Stress  Reading MD: Dola Argyle, MD  Order Authorizing Provider:  Shawnie Dapper, MD  Resting Radionuclide: Technetium 46m Sestamibi  Resting Radionuclide Dose: 11.0 mCi   Stress Radionuclide:  Technetium 7m Sestamibi  Stress Radionuclide Dose: 33.0 mCi           Stress Protocol Rest HR: 76 Stress HR: 155  Rest BP: 76 Stress BP: 140/41  Exercise Time (min): 6:30 METS: 7.7   Predicted Max HR: 153 bpm % Max HR: 101.31 bpm Rate Pressure Product: 21700   Dose of Adenosine (mg):  n/a Dose of Lexiscan: n/a mg  Dose of Atropine (mg): n/a Dose of Dobutamine: n/a mcg/kg/min (at max HR)  Stress Test Technologist: Letta Moynahan, CMA-N  Nuclear Technologist:  Charlton Amor, CNMT     Rest Procedure:  Myocardial perfusion imaging was performed at rest 45 minutes following the intravenous administration of Technetium 57m Sestamibi.  Rest ECG: Normal sinus rhythm with normal EKG  Stress Procedure:  The patient exercised on the treadmill utilizing the Bruce Protocol for 6:30 minutes. The  patient stopped due to fatigue and denied any chest pain.  Technetium 51m Sestamibi was injected at peak exercise and myocardial perfusion imaging was performed after a brief delay.  Stress ECG: No significant change from baseline ECG  QPS Raw Data Images:  Normal; no motion artifact; normal heart/lung ratio. Stress Images:  No significant abnormalities are noted Rest Images:  Normal homogeneous uptake in all areas of the myocardium. Subtraction (SDS):  No evidence of ischemia. Transient Ischemic Dilatation (Normal <1.22):  0.92 Lung/Heart Ratio (Normal <0.45):  0.31  Quantitative Gated Spect Images QGS EDV:  88 ml QGS ESV:  40 ml  Impression Exercise Capacity:  Fair exercise capacity. BP Response:  Normal blood pressure response. Clinical Symptoms:  patient had fatigue ECG Impression:  No significant ST segment change suggestive of ischemia. Comparison with Prior Nuclear Study: No images to compare  Overall Impression:  There are no diagnostic abnormalities suggesting scar or ischemia. There is very slight decreased activity in the inferior wall. This is probably attenuation. There is some septal dyssynergy (no CABG and no IVCD).  I cannot be sure if this wall motion abnormality is real or not. This is a low risk scan.  LV Ejection Fraction: 54%.  LV Wall Motion:  There is some septal dyssynergy.  Dola Argyle, MD

## 2013-04-11 ENCOUNTER — Encounter: Payer: Self-pay | Admitting: Family Medicine

## 2013-04-19 ENCOUNTER — Telehealth: Payer: Self-pay | Admitting: Family Medicine

## 2013-04-19 NOTE — Telephone Encounter (Signed)
Patient called wanting samples of cialis or viagra.  Giving patient two samples of cialis 20mg  per provider.  I will put them at front desk for patient to pick up.

## 2013-04-19 NOTE — Telephone Encounter (Signed)
Patient called back & asked if he could pick up a paper Rx for Cialis. He does not want the Rx called in to the pharmacy.

## 2013-04-20 ENCOUNTER — Other Ambulatory Visit: Payer: Self-pay | Admitting: Family Medicine

## 2013-04-20 MED ORDER — TADALAFIL 10 MG PO TABS: ORAL_TABLET | ORAL | Status: DC

## 2013-05-11 ENCOUNTER — Other Ambulatory Visit: Payer: Self-pay

## 2013-06-28 ENCOUNTER — Encounter: Payer: Self-pay | Admitting: Family Medicine

## 2013-06-28 ENCOUNTER — Ambulatory Visit (INDEPENDENT_AMBULATORY_CARE_PROVIDER_SITE_OTHER): Payer: Medicare Other | Admitting: Family Medicine

## 2013-06-28 VITALS — BP 100/67 | HR 70 | Temp 97.2°F | Resp 16 | Ht 72.0 in | Wt 211.0 lb

## 2013-06-28 DIAGNOSIS — M26629 Arthralgia of temporomandibular joint, unspecified side: Secondary | ICD-10-CM

## 2013-06-28 DIAGNOSIS — R05 Cough: Secondary | ICD-10-CM | POA: Insufficient documentation

## 2013-06-28 HISTORY — DX: Arthralgia of temporomandibular joint, unspecified side: M26.629

## 2013-06-28 NOTE — Progress Notes (Signed)
.gen1OFFICE NOTE  06/28/2013  CC:  Chief Complaint  Patient presents with  . Otalgia    Pt had R ear pain, went away this am  . Cough    a couple weeks off and on     HPI: Patient is a 67 y.o. Caucasian male who is here for cough and ear pain. Onset of right ear pain on and off 5 days ago.  Tylenol helped x 2.  Better last 24h. Some dry cough x 2 wks now: has some 15 min periods of this cough, mostly when he lies down at night and then it spontaneously resolved.  No nasal cong, runny nose, or fevers.  No ST.  No malaise.  Pertinent PMH:  Past Medical History  Diagnosis Date  . Diabetes mellitus     HbA1c 6.5% on 02/27/10.  Hx of 384 mg on 24h urine  . Cancer 1996    Bladder, remission; last urol re-eval at [facility] 02/2008  . CRI (chronic renal insufficiency)     Stage 3: Obstructive uropathy + medicorenal dz:  baseline Cr 2.5 (stage II/III).  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 Hyperlipidemia     Hypertriglyceridemia (on and off meds over the years)  . ED (erectile dysfunction)   . Elevated transaminase level 2013    Abd u/s nl; viral hep serologies neg   Past surgical, social, and family history reviewed and no changes noted since last office visit.  MEDS:  Outpatient Prescriptions Prior to Visit  Medication Sig Dispense Refill  . aspirin 81 MG tablet Take 81 mg by mouth daily.        . insulin glargine (LANTUS) 100 UNIT/ML injection Inject 70 Units into the skin at bedtime. DX: 250.00  30 mL  prn  . insulin lispro (HUMALOG) 100 UNIT/ML injection Inject 20-30 Units into the skin 3 (three) times daily before meals. DX 250.00  20 mL  5  . Insulin Pen Needle 32G X 6 MM MISC Use for your pen insulin injection 4 times per day  100 each  10  . Insulin Syringe-Needle U-100 (BD INSULIN SYRINGE ULTRAFINE) 31G X 5/16" 1 ML MISC Use as directed to administer your 70/30 insulin.  DX: 250.00  100 each  prn  .  lisinopril (PRINIVIL,ZESTRIL) 20 MG tablet Take 1 tablet (20 mg total) by mouth daily.  90 tablet  2  . Multiple Vitamin (MULTIVITAMIN) tablet Take 1 tablet by mouth daily.        . tadalafil (CIALIS) 10 MG tablet 1-2 tabs po qd prn  10 tablet  3  . insulin glargine (LANTUS SOLOSTAR) 100 UNIT/ML injection Inject 70 Units into the skin at bedtime.  5 pen  PRN  . traMADol (ULTRAM) 50 MG tablet 1-2 tabs po q6h prn pain  30 tablet  1   No facility-administered medications prior to visit.    PE: Blood pressure 100/67, pulse 70, temperature 97.2 F (36.2 C), temperature source Temporal, resp. rate 16, height 6' (1.829 m), weight 211 lb (95.709 kg), SpO2 99.00%. Gen: Alert, well appearing.  Patient is oriented to person, place, time, and situation. ENT: Ears: EACs clear, normal epithelium.  TMs with good light reflex and landmarks bilaterally.  Eyes: no injection, icteris, swelling, or exudate.  EOMI, PERRLA. Nose: no drainage or turbinate edema/swelling.  No injection or focal lesion.  Mouth: lips without lesion/swelling.  Oral mucosa pink and moist.  Dentition intact and without  obvious caries or gingival swelling.  Oropharynx without erythema, exudate, or swelling.  Right TMJ joint mildly TTP, without subluxation or popping. Neck - No masses or thyromegaly or limitation in range of motion CV: RRR, no m/r/g.   LUNGS: CTA bilat, nonlabored resps, good aeration in all lung fields.    IMPRESSION AND PLAN:  1) TMJ pain: discussed NSAID or tylenol use prn, avoid overchewing food on one side of mouth, avoid gum chewing, use bite guard/mouth piece hs prn.  2) Cough: likely mild viral bronchitis.  Continue to treat symptomatically. Signs/symptoms to call or return for were reviewed and pt expressed understanding.  An After Visit Summary was printed and given to the patient.  FOLLOW UP: prn

## 2013-07-25 ENCOUNTER — Other Ambulatory Visit: Payer: Self-pay | Admitting: Family Medicine

## 2013-08-01 ENCOUNTER — Telehealth: Payer: Self-pay | Admitting: Family Medicine

## 2013-08-02 NOTE — Telephone Encounter (Signed)
Talked with pt. He is having trouble with the implanted device that helps his neobladder open and close.   Pls call [facility] (or advanced home care) and see if we can get him a condom catheter to wear while he waits until his appt with his Psychologist, sport and exercise at [facility].   Pls find out how long he can wear one (does he need to change it daily or can he wear one for a week at a time?). -thx

## 2013-08-03 ENCOUNTER — Telehealth: Payer: Self-pay | Admitting: Family Medicine

## 2013-08-03 NOTE — Telephone Encounter (Signed)
Pt notified of condom cath info.

## 2013-08-03 NOTE — Telephone Encounter (Signed)
Spoke to [facility] and they said they have the condom catheters but they weren't able to tell me how often he would need to change it.  They won't need an order for these, he will have to just go to [facility] and decide what size from a chart and pick them up.  The lady I spoke with said they weren't very expensive.

## 2013-08-23 DIAGNOSIS — R32 Unspecified urinary incontinence: Secondary | ICD-10-CM | POA: Insufficient documentation

## 2013-08-28 ENCOUNTER — Other Ambulatory Visit: Payer: Self-pay | Admitting: Family Medicine

## 2013-08-29 NOTE — Telephone Encounter (Signed)
Lantus RF done.

## 2013-08-29 NOTE — Telephone Encounter (Signed)
Refill seems appropriate however I was unsure of dose to give patient.   Please advise lantus refill.

## 2013-09-07 ENCOUNTER — Other Ambulatory Visit: Payer: Self-pay

## 2013-09-22 ENCOUNTER — Telehealth: Payer: Self-pay | Admitting: Family Medicine

## 2013-09-22 MED ORDER — LOSARTAN POTASSIUM 50 MG PO TABS: 50.0000 mg | ORAL_TABLET | Freq: Every day | ORAL | Status: DC

## 2013-09-22 NOTE — Telephone Encounter (Signed)
Patient has been on lisonpril for a year. Patient has noticed that he has been coughing a lot. It has gotten worse. When he lays down at night it so bad he can't sleep. He stopped taking the medication a week ago & he is not coughing as much. Can he get a different medication?

## 2013-09-22 NOTE — Telephone Encounter (Signed)
Please advise 

## 2013-09-22 NOTE — Telephone Encounter (Signed)
OK. Generic cozaar (Losartan) 50mg  sent to [facility], 30 d supply. If tolerating this well and bp ok I would plan on maxing his dose out at 100mg  qd.   Have him take 50mg  once daily and monitor bp a few times a week until next f/u appt, which should be in about 2 months.-thx

## 2013-09-22 NOTE — Telephone Encounter (Signed)
Patient aware.

## 2013-10-20 ENCOUNTER — Telehealth: Payer: Self-pay | Admitting: Family Medicine

## 2013-10-20 NOTE — Telephone Encounter (Signed)
Patient left VM on phone at the front desk. He is requesting a CB by either Dr. Anitra Lauth or Lattie Haw. Please call

## 2013-10-24 NOTE — Telephone Encounter (Signed)
Patient states he got it taken care of and didn't need to talk about anything now.

## 2013-11-06 ENCOUNTER — Encounter: Payer: Self-pay | Admitting: Family Medicine

## 2013-11-06 ENCOUNTER — Ambulatory Visit (INDEPENDENT_AMBULATORY_CARE_PROVIDER_SITE_OTHER): Payer: Medicare HMO | Admitting: Family Medicine

## 2013-11-06 VITALS — BP 99/76 | HR 69 | Temp 97.8°F | Ht 72.0 in | Wt 219.5 lb

## 2013-11-06 DIAGNOSIS — N139 Obstructive and reflux uropathy, unspecified: Secondary | ICD-10-CM

## 2013-11-06 DIAGNOSIS — N183 Chronic kidney disease, stage 3 unspecified: Secondary | ICD-10-CM

## 2013-11-06 DIAGNOSIS — N189 Chronic kidney disease, unspecified: Secondary | ICD-10-CM

## 2013-11-06 DIAGNOSIS — N058 Unspecified nephritic syndrome with other morphologic changes: Secondary | ICD-10-CM

## 2013-11-06 DIAGNOSIS — E1129 Type 2 diabetes mellitus with other diabetic kidney complication: Secondary | ICD-10-CM

## 2013-11-06 DIAGNOSIS — E1121 Type 2 diabetes mellitus with diabetic nephropathy: Secondary | ICD-10-CM

## 2013-11-06 DIAGNOSIS — N390 Urinary tract infection, site not specified: Secondary | ICD-10-CM

## 2013-11-06 DIAGNOSIS — E119 Type 2 diabetes mellitus without complications: Secondary | ICD-10-CM

## 2013-11-06 LAB — POCT URINALYSIS DIPSTICK
BILIRUBIN UA: NEGATIVE
Glucose, UA: NEGATIVE
KETONES UA: NEGATIVE
Nitrite, UA: NEGATIVE
PH UA: 6
SPEC GRAV UA: 1.015
Urobilinogen, UA: 0.2

## 2013-11-06 LAB — BASIC METABOLIC PANEL
BUN: 36 mg/dL — AB (ref 6–23)
CHLORIDE: 107 meq/L (ref 96–112)
CO2: 24 meq/L (ref 19–32)
Calcium: 9.8 mg/dL (ref 8.4–10.5)
Creatinine, Ser: 2.4 mg/dL — ABNORMAL HIGH (ref 0.4–1.5)
GFR: 29.46 mL/min — ABNORMAL LOW (ref >60.00)
GLUCOSE: 147 mg/dL — AB (ref 70–99)
POTASSIUM: 4.8 meq/L (ref 3.5–5.1)
Sodium: 140 mEq/L (ref 135–145)

## 2013-11-06 LAB — HEMOGLOBIN A1C: Hgb A1c MFr Bld: 6.7 % — ABNORMAL HIGH (ref 4.6–6.5)

## 2013-11-06 NOTE — Progress Notes (Signed)
OFFICE NOTE  11/06/2013  CC:  Chief Complaint  Patient presents with  . Follow-up     HPI: Patient is a 68 y.o. Caucasian male who is here for routine f/u chronic med illnesses: DM, CRI. Says he is feeling well. Apparently dx'd with UTI by [facility] urology and was treated x 2 rounds of antibiotics and his incontinence problem resolved completely.  Therefore, his implanted urinary sphincter device was not replaced as planned---they said it is functioning well. He says he had to treat himself once again over the holidays with some cipro he had at a relatives home and all incontinence resolved once again. Given this info, he asks if he should have his urine retested, should he be on more abx, etc. He says he currently has complete control of his urine, denies any dysuria or urinary urgency or frequency.  No hematuria.  He also asks about his CRI.  We have discussed this problem in depth in the past, but it seems that he is only now completely understanding what has been happening regarding his rising creatinine, his proteinuria, and declining GFR. We have maxed out his ARB at only 50mg  qd b/c his bp is low normal on this dosing. Apparently his urologist at [facility] wanted him to get a CT scan with IV and oral contrast locally but they would not do it due to his low GFR.  Pertinent PMH:  Past Medical History  Diagnosis Date  . Diabetes mellitus     HbA1c 6.5% on 02/27/10.  Hx of 384 mg on 24h urine  . 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 CRI (chronic renal insufficiency)     Stage 3: Obstructive uropathy + medicorenal dz:  baseline Cr 2.5 (stage II/III).  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 Hyperlipidemia     Hypertriglyceridemia (on and off meds over the years)  . ED (erectile dysfunction)   . Elevated transaminase level 2013    Abd u/s nl; viral hep serologies neg    MEDS:   Outpatient Prescriptions Prior to Visit  Medication Sig Dispense Refill  . aspirin 81 MG tablet Take 81 mg by mouth daily.        . insulin glargine (LANTUS SOLOSTAR) 100 UNIT/ML injection Inject 70 Units into the skin at bedtime.  5 pen  PRN  . insulin glargine (LANTUS) 100 UNIT/ML injection Inject 70 Units into the skin at bedtime. DX: 250.00  30 mL  prn  . insulin lispro (HUMALOG) 100 UNIT/ML injection Inject 20-30 Units into the skin 3 (three) times daily before meals. DX 250.00  20 mL  5  . Insulin Pen Needle 32G X 6 MM MISC Use for your pen insulin injection 4 times per day  100 each  10  . Insulin Syringe-Needle U-100 (BD INSULIN SYRINGE ULTRAFINE) 31G X 5/16" 1 ML MISC Use as directed to administer your 70/30 insulin.  DX: 250.00  100 each  prn  . LANTUS 100 UNIT/ML injection INJECT 70 UNITS SUBCUTANEOUSLY AT BEDTIME.  3 vial  6  . losartan (COZAAR) 50 MG tablet Take 1 tablet (50 mg total) by mouth daily.  30 tablet  1  . Multiple Vitamin (MULTIVITAMIN) tablet Take 1 tablet by mouth daily.        . tadalafil (CIALIS) 10 MG tablet 1-2 tabs po qd prn  10 tablet  3  . traMADol (ULTRAM) 50 MG  tablet 1-2 tabs po q6h prn pain  30 tablet  1   No facility-administered medications prior to visit.    PE: Blood pressure 99/76, pulse 69, temperature 97.8 F (36.6 C), temperature source Temporal, height 6' (1.829 m), weight 219 lb 8 oz (99.565 kg), SpO2 99.00%. Gen: Alert, well appearing.  Patient is oriented to person, place, time, and situation. AFFECT: pleasant, lucid thought and speech. No further exam today.  LAB: CC UA today showed large blood, large LEU, and small protein.  IMPRESSION AND PLAN:  Type II or unspecified type diabetes mellitus without mention of complication, not stated as uncontrolled Check HbA1c today as well as repeat urine microalb/cr and BMET. He is UTD on pneumovax but he declined flu vaccine today.  CRI (chronic renal insufficiency) I think this is primarily  diabetic nephropathy but also think hx of obstructive uropathy has played a role. Stage III, borderline stage IV. Repeat Cr and microalb/cr today. I recommended nephrologist referral repeatedly today but he still wants to hold off and see what his current creatinine/GRF are. Continue cozaar 50mg  qd---BP is too low to push dose any higher.  Infection of urinary tract Apparently incontinence is his only symptom when he gets a UTI. Will repeat urine culture (UA markedly abnormal today) and then have conversation about what to do regarding abx or not (since he is currently asymptomatic). 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    An After Visit Summary was printed and given to the patient.  FOLLOW UP: 4 mo  ADDENDUM 11/08/13:  Talked to patient about results of labs from 11/06/13 and decided to refer to [facility] nephrology dept. Also decided to send in rx for bactrim for pt to have on hand IF he gets incontinence again but the plan for now is to NOT treat asymptomatic bacteruria.  If UTIs are recurrent in the near future, will likely start prophylactic dose of bactrim (or other abx depending on urine culture results in the future).  --PM

## 2013-11-06 NOTE — Progress Notes (Signed)
Pre visit review using our clinic review tool, if applicable. No additional management support is needed unless otherwise documented below in the visit note. 

## 2013-11-07 LAB — MICROALBUMIN / CREATININE URINE RATIO
CREATININE, U: 65.1 mg/dL
MICROALB UR: 20.1 mg/dL — AB (ref 0.0–1.9)
Microalb Creat Ratio: 30.9 mg/g — ABNORMAL HIGH (ref 0.0–30.0)

## 2013-11-08 ENCOUNTER — Encounter: Payer: Self-pay | Admitting: Family Medicine

## 2013-11-08 DIAGNOSIS — N390 Urinary tract infection, site not specified: Secondary | ICD-10-CM | POA: Insufficient documentation

## 2013-11-08 LAB — URINE CULTURE: Colony Count: >=100000

## 2013-11-08 MED ORDER — SULFAMETHOXAZOLE-TMP DS 800-160 MG PO TABS: 1.0000 | ORAL_TABLET | Freq: Two times a day (BID) | ORAL | Status: DC

## 2013-11-08 NOTE — Assessment & Plan Note (Signed)
Check HbA1c today as well as repeat urine microalb/cr and BMET. He is UTD on pneumovax but he declined flu vaccine today.

## 2013-11-08 NOTE — Assessment & Plan Note (Signed)
Apparently incontinence is his only symptom when he gets a UTI. Will repeat urine culture (UA markedly abnormal today) and then have conversation about what to do regarding abx or not (since he is currently asymptomatic). 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

## 2013-11-08 NOTE — Assessment & Plan Note (Signed)
I think this is primarily diabetic nephropathy but also think hx of obstructive uropathy has played a role. Stage III, borderline stage IV. Repeat Cr and microalb/cr today. I recommended nephrologist referral repeatedly today but he still wants to hold off and see what his current creatinine/GRF are. Continue cozaar 50mg  qd---BP is too low to push dose any higher.

## 2013-11-09 ENCOUNTER — Encounter: Payer: Self-pay | Admitting: Family Medicine

## 2013-11-09 ENCOUNTER — Ambulatory Visit: Payer: Medicare HMO

## 2013-11-09 DIAGNOSIS — R7401 Elevation of levels of liver transaminase levels: Secondary | ICD-10-CM

## 2013-11-09 DIAGNOSIS — R740 Nonspecific elevation of levels of transaminase and lactic acid dehydrogenase [LDH]: Principal | ICD-10-CM

## 2013-11-09 LAB — HEPATIC FUNCTION PANEL
ALT: 54 U/L — ABNORMAL HIGH (ref 0–53)
AST: 36 U/L (ref 0–37)
Albumin: 4.4 g/dL (ref 3.5–5.2)
Alkaline Phosphatase: 50 U/L (ref 39–117)
BILIRUBIN TOTAL: 0.4 mg/dL (ref 0.3–1.2)
Bilirubin, Direct: 0.1 mg/dL (ref 0.0–0.3)
Total Protein: 8.1 g/dL (ref 6.0–8.3)

## 2013-12-01 ENCOUNTER — Telehealth: Payer: Self-pay | Admitting: Family Medicine

## 2013-12-01 DIAGNOSIS — N139 Obstructive and reflux uropathy, unspecified: Secondary | ICD-10-CM

## 2013-12-01 DIAGNOSIS — R809 Proteinuria, unspecified: Secondary | ICD-10-CM

## 2013-12-01 DIAGNOSIS — E118 Type 2 diabetes mellitus with unspecified complications: Secondary | ICD-10-CM

## 2013-12-01 DIAGNOSIS — N189 Chronic kidney disease, unspecified: Secondary | ICD-10-CM

## 2013-12-01 NOTE — Telephone Encounter (Signed)
Renal ultrasound ordered for GI on [address].

## 2013-12-01 NOTE — Telephone Encounter (Signed)
Patient was told to get U/S of kidneys by [facility].  Patient would like an appointment with someone [place] [facility].  Please advise.

## 2013-12-04 ENCOUNTER — Ambulatory Visit
Admission: RE | Admit: 2013-12-04 | Discharge: 2013-12-04 | Disposition: A | Discharge status: Home / Self Care | Payer: Medicare HMO | Source: Ambulatory Visit | Attending: Family Medicine | Admitting: Family Medicine

## 2013-12-04 DIAGNOSIS — E118 Type 2 diabetes mellitus with unspecified complications: Secondary | ICD-10-CM

## 2013-12-04 DIAGNOSIS — N139 Obstructive and reflux uropathy, unspecified: Secondary | ICD-10-CM

## 2013-12-04 DIAGNOSIS — N189 Chronic kidney disease, unspecified: Secondary | ICD-10-CM

## 2013-12-04 DIAGNOSIS — R809 Proteinuria, unspecified: Secondary | ICD-10-CM

## 2013-12-12 ENCOUNTER — Other Ambulatory Visit: Payer: Self-pay | Admitting: Family Medicine

## 2013-12-12 NOTE — Telephone Encounter (Signed)
Called pt, he states he had two Rx's for Sulfa and he lost one. He would just like another Rx sent to his pharmacy. He cannot locate the other Rx. Please advise

## 2013-12-20 ENCOUNTER — Other Ambulatory Visit: Payer: Self-pay | Admitting: Family Medicine

## 2014-01-22 ENCOUNTER — Other Ambulatory Visit: Payer: Self-pay | Admitting: Family Medicine

## 2014-01-29 ENCOUNTER — Other Ambulatory Visit: Payer: Self-pay | Admitting: Family Medicine

## 2014-01-29 NOTE — Telephone Encounter (Signed)
Patient asks that in the future the Humalog be sent in for a quantity of 2 instead of 1 to [facility]. His insurance covers the cost better.

## 2014-02-19 ENCOUNTER — Other Ambulatory Visit: Payer: Self-pay | Admitting: Family Medicine

## 2014-02-19 MED ORDER — INSULIN LISPRO 100 UNIT/ML ~~LOC~~ SOLN: SUBCUTANEOUS | Status: DC

## 2014-02-27 ENCOUNTER — Other Ambulatory Visit: Payer: Self-pay

## 2014-03-06 ENCOUNTER — Ambulatory Visit: Payer: Medicare HMO | Admitting: Family Medicine

## 2014-03-14 ENCOUNTER — Ambulatory Visit (INDEPENDENT_AMBULATORY_CARE_PROVIDER_SITE_OTHER): Payer: Medicare HMO | Admitting: Family Medicine

## 2014-03-14 ENCOUNTER — Encounter: Payer: Self-pay | Admitting: Family Medicine

## 2014-03-14 VITALS — BP 99/64 | HR 66 | Temp 97.6°F | Resp 18 | Ht 72.0 in | Wt 219.0 lb

## 2014-03-14 DIAGNOSIS — R740 Nonspecific elevation of levels of transaminase and lactic acid dehydrogenase [LDH]: Secondary | ICD-10-CM

## 2014-03-14 DIAGNOSIS — E782 Mixed hyperlipidemia: Secondary | ICD-10-CM

## 2014-03-14 DIAGNOSIS — R7401 Elevation of levels of liver transaminase levels: Secondary | ICD-10-CM

## 2014-03-14 DIAGNOSIS — N189 Chronic kidney disease, unspecified: Secondary | ICD-10-CM

## 2014-03-14 DIAGNOSIS — E118 Type 2 diabetes mellitus with unspecified complications: Secondary | ICD-10-CM

## 2014-03-14 DIAGNOSIS — R7402 Elevation of levels of lactic acid dehydrogenase (LDH): Secondary | ICD-10-CM

## 2014-03-14 LAB — COMPREHENSIVE METABOLIC PANEL
ALK PHOS: 52 U/L (ref 39–117)
ALT: 61 U/L — AB (ref 0–53)
AST: 41 U/L — AB (ref 0–37)
Albumin: 4.2 g/dL (ref 3.5–5.2)
BILIRUBIN TOTAL: 0.7 mg/dL (ref 0.2–1.2)
BUN: 34 mg/dL — ABNORMAL HIGH (ref 6–23)
CO2: 27 mEq/L (ref 19–32)
Calcium: 10.4 mg/dL (ref 8.4–10.5)
Chloride: 106 mEq/L (ref 96–112)
Creatinine, Ser: 2.1 mg/dL — ABNORMAL HIGH (ref 0.4–1.5)
GFR: 33.15 mL/min — ABNORMAL LOW (ref >60.00)
Glucose, Bld: 134 mg/dL — ABNORMAL HIGH (ref 70–99)
Potassium: 4.5 mEq/L (ref 3.5–5.1)
SODIUM: 139 meq/L (ref 135–145)
TOTAL PROTEIN: 7.9 g/dL (ref 6.0–8.3)

## 2014-03-14 LAB — LIPID PANEL
CHOLESTEROL: 156 mg/dL (ref 0–200)
HDL: 32.8 mg/dL — AB (ref >39.00)
LDL Cholesterol: 104 mg/dL — ABNORMAL HIGH (ref 0–99)
Total CHOL/HDL Ratio: 5
Triglycerides: 97 mg/dL (ref 0.0–149.0)
VLDL: 19.4 mg/dL (ref 0.0–40.0)

## 2014-03-14 LAB — HEMOGLOBIN A1C: HEMOGLOBIN A1C: 6.5 % (ref 4.6–6.5)

## 2014-03-14 LAB — MICROALBUMIN / CREATININE URINE RATIO
Creatinine,U: 75.2 mg/dL
MICROALB UR: 35 mg/dL — AB (ref 0.0–1.9)
Microalb Creat Ratio: 46.5 mg/g — ABNORMAL HIGH (ref 0.0–30.0)

## 2014-03-14 MED ORDER — LOSARTAN POTASSIUM 50 MG PO TABS: ORAL_TABLET | ORAL | Status: DC

## 2014-03-14 NOTE — Progress Notes (Signed)
OFFICE NOTE  03/14/14  CC:  Chief Complaint  Patient presents with  . Follow-up    HPI: Patient is a 68 y.o. Caucasian male who is here for 5 mo f/u Dm 2, CRI, hx of poor bladder emptying/UTI. Moving to [place]. Recently returned from [place].  Using much less insulin b/c glucoses had been so normal. Now taking 2 1000 mg cinnamin tabs tid: says this has cut down on insulin needs quite a bit--sometimes doesn't need any insulin.  Largely compliant with diet.  Takes avg 15 U humalog qAC, usually no lantus at all now.  Also on an herbal supplement that he does not know what is in it (last 3-4 wks)----he has a habit of doing this type of thing.  No problems with UTI sx's lately.  Feet: mild chronic tingly/numb feeling.  No pain, no hx of ulcer.  Pertinent PMH:  Past medical, surgical, social, and family history reviewed and no changes are noted since last office visit.  MEDS:  Outpatient Prescriptions Prior to Visit  Medication Sig Dispense Refill  . aspirin 81 MG tablet Take 81 mg by mouth daily.        . insulin glargine (LANTUS) 100 UNIT/ML injection Inject 70 Units into the skin at bedtime. DX: 250.00  30 mL  prn  . insulin lispro (HUMALOG) 100 UNIT/ML injection INJECT 20-30 UNITS INTO THE SKIN 3 (THREE) TIMES DAILY BEFORE MEALS  3 vial  1  . Multiple Vitamin (MULTIVITAMIN) tablet Take 1 tablet by mouth daily.        Marland Kitchen losartan (COZAAR) 50 MG tablet TAKE 1 TABLET BY MOUTH DAILY.  30 tablet  1  . Insulin Pen Needle 32G X 6 MM MISC Use for your pen insulin injection 4 times per day  100 each  10  . Insulin Syringe-Needle U-100 (BD INSULIN SYRINGE ULTRAFINE) 31G X 5/16" 1 ML MISC Use as directed to administer your 70/30 insulin.  DX: 250.00  100 each  prn  . sulfamethoxazole-trimethoprim (BACTRIM DS) 800-160 MG per tablet TAKE 1 TABLET BY MOUTH TWICE A DAY  10 tablet  1  . tadalafil (CIALIS) 10 MG tablet 1-2 tabs po qd prn  10 tablet  3  . traMADol (ULTRAM) 50 MG tablet 1-2  tabs po q6h prn pain  30 tablet  1  . insulin glargine (LANTUS SOLOSTAR) 100 UNIT/ML injection Inject 70 Units into the skin at bedtime.  5 pen  PRN  . LANTUS 100 UNIT/ML injection INJECT 70 UNITS SUBCUTANEOUSLY AT BEDTIME.  3 vial  6   No facility-administered medications prior to visit.    PE: Blood pressure 99/64, pulse 66, temperature 97.6 F (36.4 C), temperature source Temporal, resp. rate 18, height 6' (1.829 m), weight 219 lb (99.338 kg), SpO2 96.00%. Gen: Alert, well appearing.  Patient is oriented to person, place, time, and situation. Foot exam - both normal; no swelling, tenderness or skin or vascular lesions. Color and temperature is normal. Sensation is intact. Peripheral pulses are palpable. Toenails are normal.   IMPRESSION AND PLAN:  Diabetes mellitus type 2 with complications Control has been good. HbA1c and urine microalbumin today. Foot exam today normal.   CRI (chronic renal insufficiency) Goes back to [facility] for nephrology f/u very soon. Check urine microalb/cr today. Cr/lytes today.  Hyperlipidemia, mixed FLP today. Hx of statin intolerance.   An After Visit Summary was printed and given to the patient.  FOLLOW UP: 72mo

## 2014-03-14 NOTE — Progress Notes (Signed)
Pre visit review using our clinic review tool, if applicable. No additional management support is needed unless otherwise documented below in the visit note. 

## 2014-03-24 ENCOUNTER — Encounter: Payer: Self-pay | Admitting: Family Medicine

## 2014-03-24 NOTE — Assessment & Plan Note (Signed)
FLP today. Hx of statin intolerance.

## 2014-03-24 NOTE — Assessment & Plan Note (Addendum)
Control has been good. HbA1c and urine microalbumin today. Foot exam today normal.

## 2014-03-24 NOTE — Assessment & Plan Note (Signed)
Goes back to [facility] for nephrology f/u very soon. Check urine microalb/cr today. Cr/lytes today.

## 2014-07-30 ENCOUNTER — Telehealth: Payer: Self-pay | Admitting: Family Medicine

## 2014-07-30 NOTE — Telephone Encounter (Signed)
Patient called in to let you know that he has another kidney infection. Symptoms started yesterday evening. He has medication which he has started. Patient not requesting a CB, only wanted to let Dr. Anitra Lauth know that he has it.

## 2014-07-30 NOTE — Telephone Encounter (Signed)
Noted  

## 2014-07-31 IMAGING — US US RENAL
1 series · 13 of 25 positions shown · non-contrast
Comparison: 03/02/2012

CLINICAL DATA: Proteinuria.  History of neobladder.

EXAM:
RENAL/URINARY TRACT ULTRASOUND COMPLETE

[Series 1: us renal · 0.29mm/px · 13 of 52 slices shown]
[im 1/52]
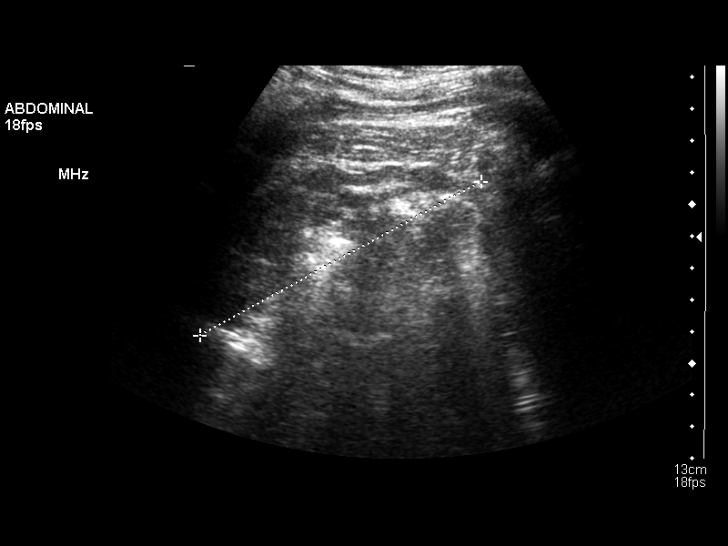
[im 5/52]
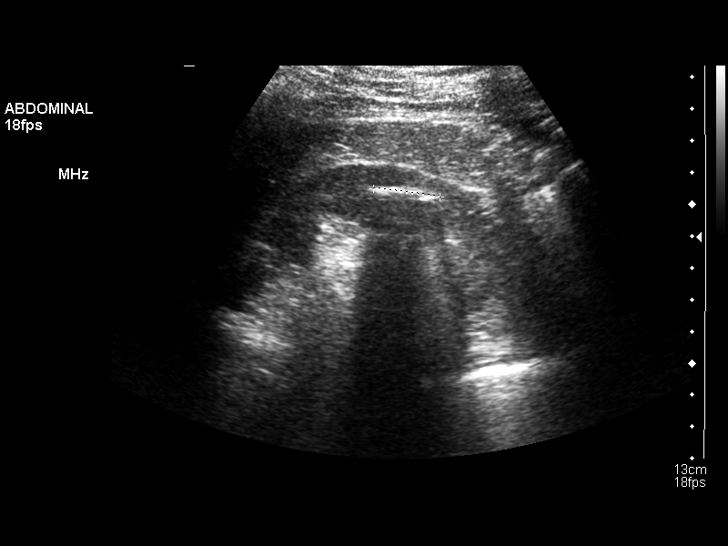
[im 9/52]
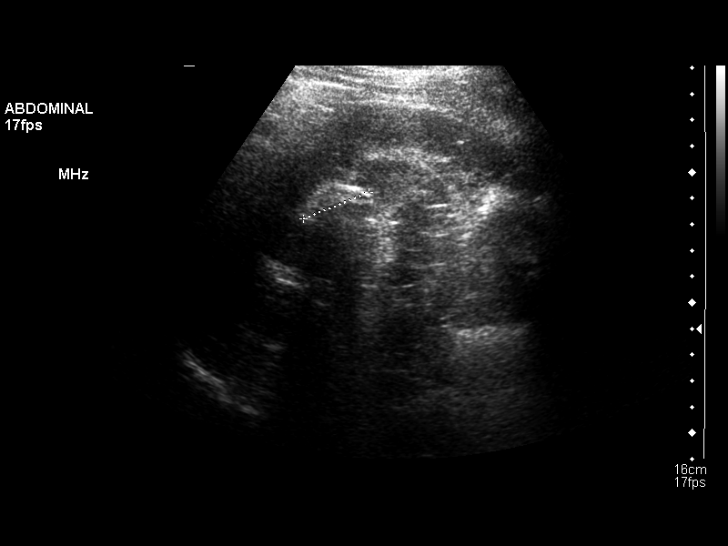
[im 13/52]
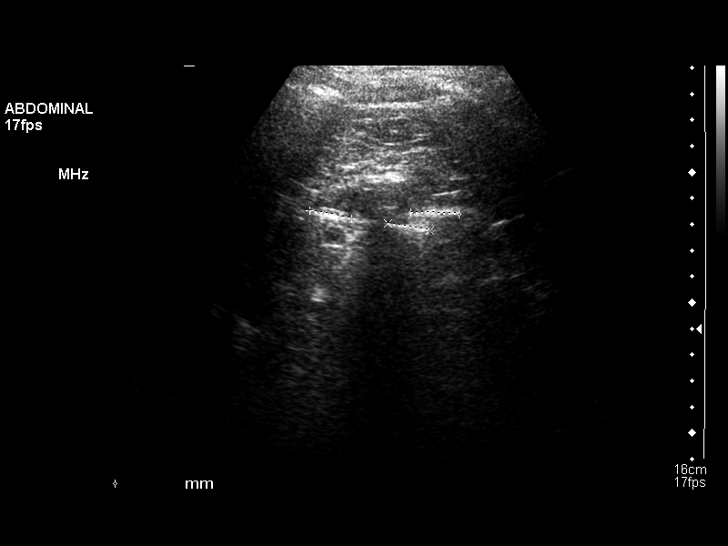
[im 18/52]
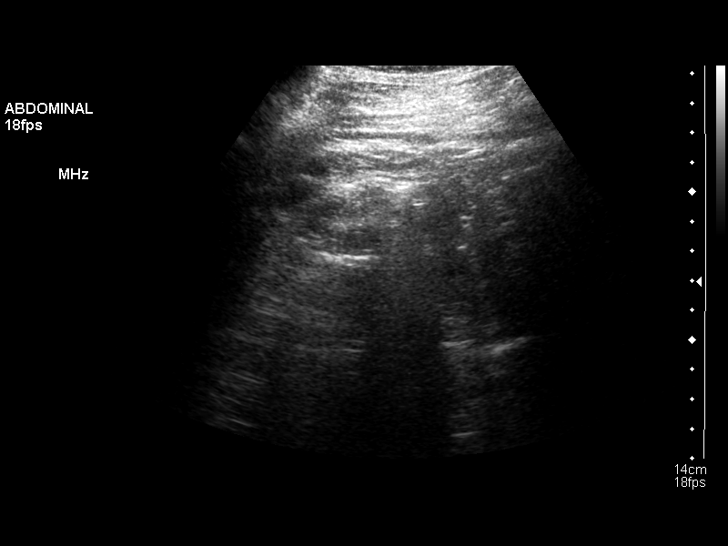
[im 22/52]
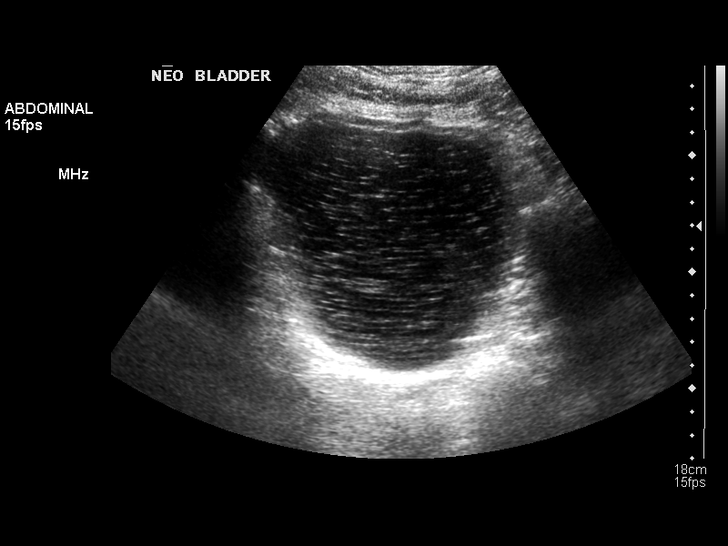
[im 26/52]
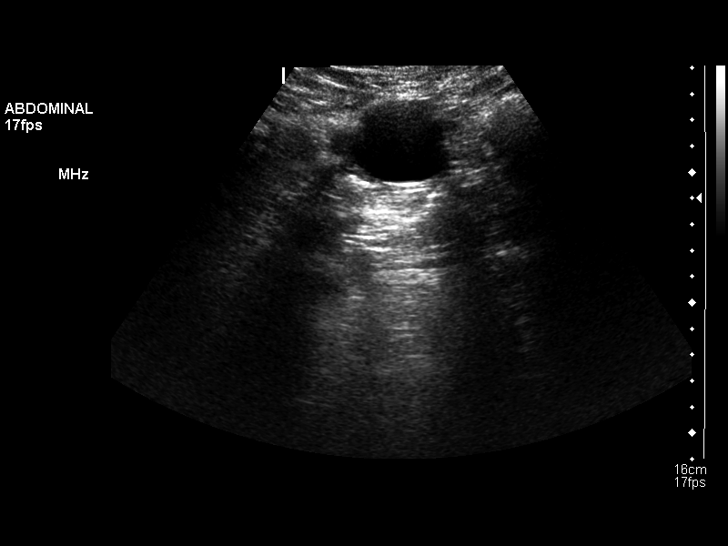
[im 30/52]
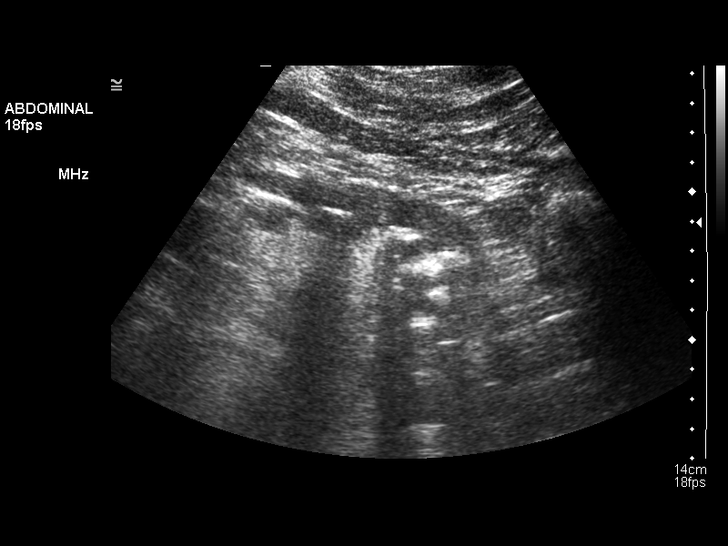
[im 35/52]
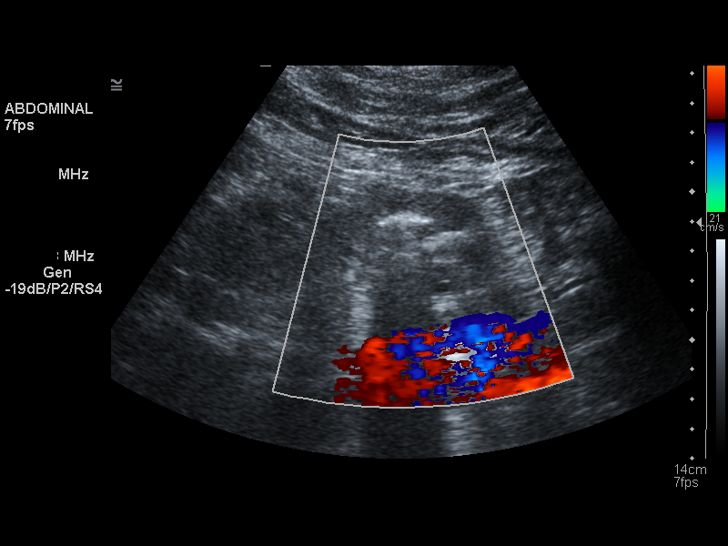
[im 39/52]
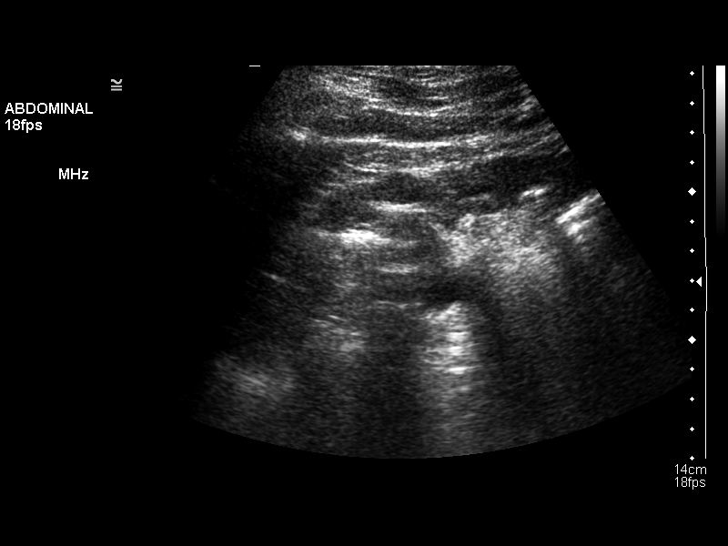
[im 43/52]
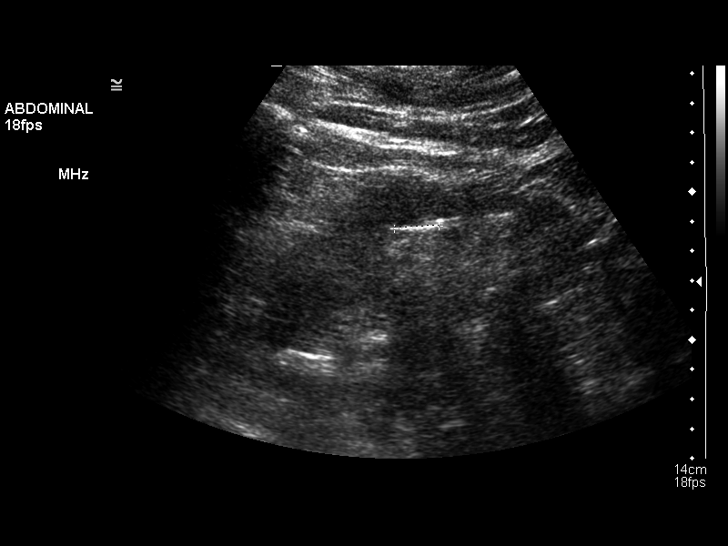
[im 47/52]
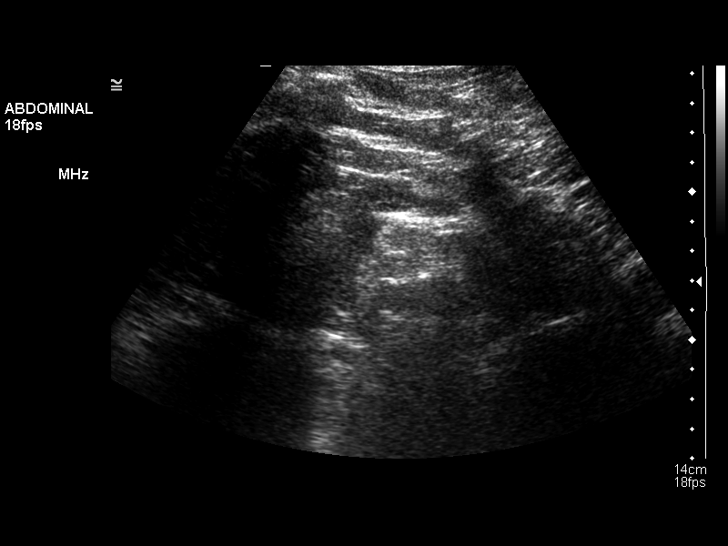
[im 52/52]
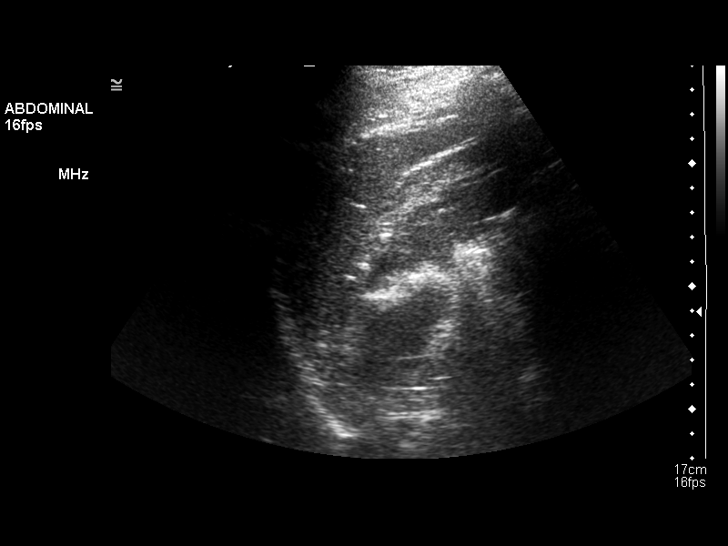

[13 of 25 positions shown; findings below may reference images not displayed]

FINDINGS: Right Kidney:

Length: 10.6 cm. The right kidney is diffusely echogenic and
heterogeneous. There are scattered echogenic areas with posterior
acoustic shadowing throughout the right kidney which may represent
large calcifications. Kidney stones cannot be excluded. No evidence
for severe right hydronephrosis but limited evaluation.

Left Kidney:

Length: 11.6 cm.. Similar to the right kidney, the left kidney is
heterogeneous and echogenic. Multiple echogenic areas with posterior
acoustic shadowing consistent with calcifications and probably
stones. There is no significant left hydronephrosis.

Bladder:

The patient has reportedly had a cystectomy and has a neobladder.
There is a hypoechoic structure with multiple internal echoes and
this may represent the neobladder. Findings appear to be similar to
the previous examination. Along the superior aspect of the
neobladder, there is a round hypoechoic structure that measures
x 3.4 x 4.1 cm.
IMPRESSION: Both kidneys are very echogenic and heterogeneous. There are linear
echogenic areas within both kidneys with extensive shadowing.
Findings could represent calcifications and stones. This could be
better characterized with a noncontrast CT. There does not appear to
be significant hydronephrosis in either kidney.

The patient reportedly has a neobladder. There is a hypoechoic
structure with multiple internal echoes that is thought to be a
neobladder. There is an indeterminate round hypoechoic or cystic
structure along the superior aspect of this neobladder. These
findings could be better characterized with CT if needed.

## 2014-09-12 ENCOUNTER — Ambulatory Visit (INDEPENDENT_AMBULATORY_CARE_PROVIDER_SITE_OTHER): Payer: Medicare HMO | Admitting: Family Medicine

## 2014-09-12 ENCOUNTER — Encounter: Payer: Self-pay | Admitting: Family Medicine

## 2014-09-12 VITALS — BP 104/69 | HR 63 | Temp 97.5°F | Ht 72.0 in | Wt 218.0 lb

## 2014-09-12 DIAGNOSIS — R740 Nonspecific elevation of levels of transaminase and lactic acid dehydrogenase [LDH]: Secondary | ICD-10-CM

## 2014-09-12 DIAGNOSIS — Z23 Encounter for immunization: Secondary | ICD-10-CM

## 2014-09-12 DIAGNOSIS — N183 Chronic kidney disease, stage 3 unspecified: Secondary | ICD-10-CM

## 2014-09-12 DIAGNOSIS — N139 Obstructive and reflux uropathy, unspecified: Secondary | ICD-10-CM | POA: Insufficient documentation

## 2014-09-12 DIAGNOSIS — N390 Urinary tract infection, site not specified: Secondary | ICD-10-CM

## 2014-09-12 DIAGNOSIS — E118 Type 2 diabetes mellitus with unspecified complications: Secondary | ICD-10-CM

## 2014-09-12 DIAGNOSIS — R7401 Elevation of levels of liver transaminase levels: Secondary | ICD-10-CM

## 2014-09-12 LAB — COMPREHENSIVE METABOLIC PANEL
ALBUMIN: 3.7 g/dL (ref 3.5–5.2)
ALK PHOS: 59 U/L (ref 39–117)
ALT: 40 U/L (ref 0–53)
AST: 31 U/L (ref 0–37)
BUN: 37 mg/dL — AB (ref 6–23)
CO2: 27 mEq/L (ref 19–32)
Calcium: 9.7 mg/dL (ref 8.4–10.5)
Chloride: 109 mEq/L (ref 96–112)
Creatinine, Ser: 2.2 mg/dL — ABNORMAL HIGH (ref 0.4–1.5)
GFR: 31.71 mL/min — ABNORMAL LOW (ref >60.00)
Glucose, Bld: 142 mg/dL — ABNORMAL HIGH (ref 70–99)
POTASSIUM: 5 meq/L (ref 3.5–5.1)
Sodium: 141 mEq/L (ref 135–145)
Total Bilirubin: 0.3 mg/dL (ref 0.2–1.2)
Total Protein: 7.8 g/dL (ref 6.0–8.3)

## 2014-09-12 LAB — HEMOGLOBIN A1C: Hgb A1c MFr Bld: 6.5 % (ref 4.6–6.5)

## 2014-09-12 MED ORDER — LOSARTAN POTASSIUM 50 MG PO TABS: ORAL_TABLET | ORAL | Status: DC

## 2014-09-12 MED ORDER — SULFAMETHOXAZOLE-TRIMETHOPRIM 800-160 MG PO TABS: 1.0000 | ORAL_TABLET | Freq: Two times a day (BID) | ORAL | Status: DC

## 2014-09-12 NOTE — Progress Notes (Signed)
Pre visit review using our clinic review tool, if applicable. No additional management support is needed unless otherwise documented below in the visit note. 

## 2014-09-12 NOTE — Progress Notes (Signed)
OFFICE NOTE  09/12/2014  CC:  Chief Complaint  Patient presents with  . Follow-up     HPI: Patient is a 68 y.o. Caucasian male who is here for 6 mo f/u DM 2, hyperlipidemia, and CRI stage 3, hx of elevated transaminases. He also is followed by Dr. Morrison Old at [facility] nephrology.  He takes 30 U [employer] brand fast acting insulin at bedtime.  No other insulins (donut hole probs). Dieting well.  Denies low sugar readings/symptoms.  He is due for diab retpthy eye exam (past due).  Has had one UTI since I last saw him.   He has appt with Dr. Olevia Bowens at [facility] today.    Pertinent PMH:  Past medical, surgical, social, and family history reviewed and no changes are noted since last office visit.  MEDS:  Outpatient Prescriptions Prior to Visit  Medication Sig Dispense Refill  . aspirin 81 MG tablet Take 81 mg by mouth daily.      . insulin glargine (LANTUS) 100 UNIT/ML injection Inject 70 Units into the skin at bedtime. DX: 250.00 30 mL prn  . insulin lispro (HUMALOG) 100 UNIT/ML injection INJECT 20-30 UNITS INTO THE SKIN 3 (THREE) TIMES DAILY BEFORE MEALS 3 vial 1  . Insulin Pen Needle 32G X 6 MM MISC Use for your pen insulin injection 4 times per day 100 each 10  . Insulin Syringe-Needle U-100 (BD INSULIN SYRINGE ULTRAFINE) 31G X 5/16" 1 ML MISC Use as directed to administer your 70/30 insulin.  DX: 250.00 100 each prn  . losartan (COZAAR) 50 MG tablet TAKE 1 TABLET BY MOUTH DAILY. 90 tablet 4  . Multiple Vitamin (MULTIVITAMIN) tablet Take 1 tablet by mouth daily.      Marland Kitchen sulfamethoxazole-trimethoprim (BACTRIM DS) 800-160 MG per tablet TAKE 1 TABLET BY MOUTH TWICE A DAY 10 tablet 1  . tadalafil (CIALIS) 10 MG tablet 1-2 tabs po qd prn 10 tablet 3  . traMADol (ULTRAM) 50 MG tablet 1-2 tabs po q6h prn pain 30 tablet 1   No facility-administered medications prior to visit.    PE: Blood pressure 104/69, pulse 63, temperature 97.5 F (36.4 C), temperature source Temporal, height 6' (1.829  m), weight 218 lb (98.884 kg), SpO2 98 %. Gen: Alert, well appearing.  Patient is oriented to person, place, time, and situation. AFFECT: pleasant, lucid thought and speech. No further exam today.  IMPRESSION AND PLAN:  1) DM 2, with CRI partially due to this and partially due to obstructive uropathy. HbA1c today. He is relying a lot on herbal treatments, plus taking an insulin I am unsure about (onset of action, duration of action, etc---timing of dose is questionable).  At any rate, this is par for the course with Herb. Reminded him again he needs to make f/u appt with his eye MD for diab rtpthy screening exam.  2) CRI, stage 3.   CMET today.  Has f/u with [facility] nephrology later today. Supplements/herbals pt is taking noted.  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 RF of bactrim sent to pharmacy for future UTIs--pt to use at his discretion, call for problems/questions.   4) Hx of elevated transaminases: likely fatty liver.  Hep screening neg. ? Of contribution from herbals/supplements in past. Repeat LFTs today.  Flu vaccine IM today. Needs prevnar at next f/u.  An After Visit Summary was printed and given to the patient.  FOLLOW UP: 4 mo

## 2014-11-15 ENCOUNTER — Telehealth: Payer: Self-pay | Admitting: *Deleted

## 2014-11-15 MED ORDER — INSULIN NPH (HUMAN) (ISOPHANE) 100 UNIT/ML ~~LOC~~ SUSP: SUBCUTANEOUS | Status: DC

## 2014-11-15 MED ORDER — SULFAMETHOXAZOLE-TRIMETHOPRIM 800-160 MG PO TABS: 1.0000 | ORAL_TABLET | Freq: Two times a day (BID) | ORAL | Status: DC

## 2014-11-15 NOTE — Telephone Encounter (Signed)
OK, both meds eRx'd as per pt request.

## 2014-11-15 NOTE — Telephone Encounter (Signed)
Patient is requesting refill for Bactrim and also rx for [facility] brand novolin.

## 2015-03-01 ENCOUNTER — Telehealth: Payer: Self-pay | Admitting: Family Medicine

## 2015-03-01 NOTE — Telephone Encounter (Signed)
I just got this in my box today and signed it this morning and it should be ready to send if not already sent.

## 2015-03-01 NOTE — Telephone Encounter (Signed)
Please advise 

## 2015-03-01 NOTE — Telephone Encounter (Signed)
Debra from [employer] called and was looking for orders for Mr. Alexander [name] diabetic shoes.  Please fax to [phone]

## 2015-03-13 ENCOUNTER — Ambulatory Visit (INDEPENDENT_AMBULATORY_CARE_PROVIDER_SITE_OTHER): Payer: Medicare PPO | Admitting: Family Medicine

## 2015-03-13 ENCOUNTER — Encounter: Payer: Self-pay | Admitting: Family Medicine

## 2015-03-13 VITALS — BP 127/81 | HR 52 | Temp 97.5°F | Resp 16 | Wt 233.0 lb

## 2015-03-13 DIAGNOSIS — E782 Mixed hyperlipidemia: Secondary | ICD-10-CM | POA: Diagnosis not present

## 2015-03-13 DIAGNOSIS — N183 Chronic kidney disease, stage 3 unspecified: Secondary | ICD-10-CM

## 2015-03-13 DIAGNOSIS — E118 Type 2 diabetes mellitus with unspecified complications: Secondary | ICD-10-CM

## 2015-03-13 DIAGNOSIS — R740 Nonspecific elevation of levels of transaminase and lactic acid dehydrogenase [LDH]: Secondary | ICD-10-CM

## 2015-03-13 DIAGNOSIS — R7401 Elevation of levels of liver transaminase levels: Secondary | ICD-10-CM

## 2015-03-13 LAB — COMPREHENSIVE METABOLIC PANEL
ALBUMIN: 4 g/dL (ref 3.5–5.2)
ALT: 33 U/L (ref 0–53)
AST: 33 U/L (ref 0–37)
Alkaline Phosphatase: 50 U/L (ref 39–117)
BUN: 24 mg/dL — AB (ref 6–23)
CALCIUM: 9.7 mg/dL (ref 8.4–10.5)
CO2: 29 meq/L (ref 19–32)
Chloride: 102 mEq/L (ref 96–112)
Creatinine, Ser: 1.93 mg/dL — ABNORMAL HIGH (ref 0.40–1.50)
GFR: 36.83 mL/min — AB (ref >60.00)
Glucose, Bld: 132 mg/dL — ABNORMAL HIGH (ref 70–99)
Potassium: 4.5 mEq/L (ref 3.5–5.1)
Sodium: 136 mEq/L (ref 135–145)
Total Bilirubin: 0.5 mg/dL (ref 0.2–1.2)
Total Protein: 7.6 g/dL (ref 6.0–8.3)

## 2015-03-13 LAB — HEMOGLOBIN A1C: Hgb A1c MFr Bld: 6.3 % (ref 4.6–6.5)

## 2015-03-13 NOTE — Progress Notes (Signed)
OFFICE NOTE  03/13/2015  CC:  Chief Complaint  Patient presents with  . Follow-up   HPI: Patient is a 69 y.o. Caucasian male who is here for 6 mo f/u DM 2, CRI stage 3, hyperlipidemia, hx of elevated transaminases.  Current insulins are Humulin N qd and humalog at mealtime. Currently using 70 U qd Relion (N), rarely using mealtime glucose.  He won't get specific about his exact glucoses but feels like control is good.  Cr rise by 0.5 last visit prompted me to have him cut his 100mg  losartan in half qd. Has f/u appt with his kidney MD at [facility] later today.  Pertinent PMH:  Past medical, surgical, social, and family history reviewed and no changes are noted since last office visit.  MEDS:  Outpatient Prescriptions Prior to Visit  Medication Sig Dispense Refill  . aspirin 81 MG tablet Take 81 mg by mouth daily.      . insulin lispro (HUMALOG) 100 UNIT/ML injection INJECT 20-30 UNITS INTO THE SKIN 3 (THREE) TIMES DAILY BEFORE MEALS 3 vial 1  . insulin NPH Human (HUMULIN N,NOVOLIN N) 100 UNIT/ML injection 40-50 units SQ 1-2 times per day 60 mL 11  . Insulin Pen Needle 32G X 6 MM MISC Use for your pen insulin injection 4 times per day 100 each 10  . Insulin Syringe-Needle U-100 (BD INSULIN SYRINGE ULTRAFINE) 31G X 5/16" 1 ML MISC Use as directed to administer your 70/30 insulin.  DX: 250.00 100 each prn  . losartan (COZAAR) 50 MG tablet TAKE 1 TABLET BY MOUTH DAILY. 90 tablet 4  . Multiple Vitamin (MULTIVITAMIN) tablet Take 1 tablet by mouth daily.      . insulin glargine (LANTUS) 100 UNIT/ML injection Inject 70 Units into the skin at bedtime. DX: 250.00 (Patient not taking: Reported on 03/13/2015) 30 mL prn  . sulfamethoxazole-trimethoprim (BACTRIM DS,SEPTRA DS) 800-160 MG per tablet Take 1 tablet by mouth 2 (two) times daily. (Patient not taking: Reported on 03/13/2015) 10 tablet 2   No facility-administered medications prior to visit.    PE: Blood pressure 127/81, pulse 52, temperature  97.5 F (36.4 C), temperature source Oral, resp. rate 16, weight 233 lb (105.688 kg), SpO2 98 %. Gen: Alert, well appearing.  Patient is oriented to person, place, time, and situation. CV: RRR, no m/r/g.   LUNGS: CTA bilat, nonlabored resps, good aeration in all lung fields. EXT: no clubbing, cyanosis, or edema.    LAB: Lab Results  Component Value Date   HGBA1C 6.5 09/12/2014     Chemistry      Component Value Date/Time   NA 141 09/12/2014 0844   K 5.0 09/12/2014 0844   CL 109 09/12/2014 0844   CO2 27 09/12/2014 0844   BUN 37* 09/12/2014 0844   CREATININE 2.2* 09/12/2014 0844   CREATININE 2.05* 03/07/2012 1050   CREATININE 1.74* 05/14/2011 1014      Component Value Date/Time   CALCIUM 9.7 09/12/2014 0844   ALKPHOS 59 09/12/2014 0844   AST 31 09/12/2014 0844   ALT 40 09/12/2014 0844   BILITOT 0.3 09/12/2014 0844     Lab Results  Component Value Date   CHOL 156 03/14/2014   HDL 32.80* 03/14/2014   LDLCALC 104* 03/14/2014   TRIG 97.0 03/14/2014   CHOLHDL 5 03/14/2014    IMPRESSION AND PLAN:  1) DM 2, control historically good. HbA1c today.  Reminded pt of need for eye screening exam again today.  2) CRI stage 3, combo of obstructive  uropathy and DM. BMET today.  F/u with nephrologist today at [facility].  3) Hx of elevated transaminases: likely fatty liver.  Viral hep screening neg. Last recheck normal. AST/ALT today.  4) Hyperlipidemia: last LDL 104 about a year ago.  TLC is his choice of treatment: one trial of statin put him off of this type of med for good.  5) Prev health care: declines prevnar and colonoscopy.  An After Visit Summary was printed and given to the patient.  FOLLOW UP: 6 mo

## 2015-03-13 NOTE — Progress Notes (Signed)
Pre visit review using our clinic review tool, if applicable. No additional management support is needed unless otherwise documented below in the visit note. 

## 2015-03-18 ENCOUNTER — Telehealth: Payer: Self-pay | Admitting: *Deleted

## 2015-03-18 NOTE — Telephone Encounter (Signed)
Pts wife requested that we send in new Rx for glucose meter to [facility] because pt uses the same meter she did and it is not covered. Rx for new meter printed and waiting on providers signature will fax to [phone] once signed.

## 2015-03-18 NOTE — Telephone Encounter (Signed)
Signed.

## 2015-05-03 ENCOUNTER — Other Ambulatory Visit: Payer: Self-pay | Admitting: *Deleted

## 2015-05-03 MED ORDER — INSULIN LISPRO 100 UNIT/ML ~~LOC~~ SOLN
SUBCUTANEOUS | Status: DC
Start: 2015-05-03 — End: 2018-10-27

## 2015-05-03 NOTE — Telephone Encounter (Signed)
Pt also LMOM requesting refill.  RF request for Humalog.  LOV: 03/13/15 Next ov: 09/10/15 Last written: 02/19/14 #3 w/1RF

## 2015-05-15 ENCOUNTER — Telehealth: Payer: Self-pay | Admitting: *Deleted

## 2015-05-15 NOTE — Telephone Encounter (Signed)
Pt LMOM stating that [place] did not get Rx for his Humalog and Novolin. Spoke to pt and he stated that he needs Rx for both Humalog and Novolin sent to [facility], [place] and a refill for his syringes. I advised pt that I will call [facility] and give verbal to refill these three medications. Spoke to [place] at [facility] and gave verbal order to refill Humalog same directions #3vails w/ 3RF. Jacqulynn Cadet stated that they have refills on file for Novolin so they do not need order for that at this time, gave verbal order for Syringe 39ml w/ needle as on file #100 w/ 3.

## 2015-09-10 ENCOUNTER — Ambulatory Visit (INDEPENDENT_AMBULATORY_CARE_PROVIDER_SITE_OTHER): Payer: Medicare PPO | Admitting: Family Medicine

## 2015-09-10 ENCOUNTER — Encounter: Payer: Self-pay | Admitting: Family Medicine

## 2015-09-10 VITALS — BP 110/75 | HR 109 | Temp 98.5°F | Resp 16 | Ht 72.0 in | Wt 238.0 lb

## 2015-09-10 DIAGNOSIS — E785 Hyperlipidemia, unspecified: Secondary | ICD-10-CM

## 2015-09-10 DIAGNOSIS — R740 Nonspecific elevation of levels of transaminase and lactic acid dehydrogenase [LDH]: Secondary | ICD-10-CM

## 2015-09-10 DIAGNOSIS — N183 Chronic kidney disease, stage 3 unspecified: Secondary | ICD-10-CM

## 2015-09-10 DIAGNOSIS — E118 Type 2 diabetes mellitus with unspecified complications: Secondary | ICD-10-CM | POA: Diagnosis not present

## 2015-09-10 DIAGNOSIS — R300 Dysuria: Secondary | ICD-10-CM

## 2015-09-10 DIAGNOSIS — R7401 Elevation of levels of liver transaminase levels: Secondary | ICD-10-CM

## 2015-09-10 LAB — COMPREHENSIVE METABOLIC PANEL
ALT: 24 U/L (ref 0–53)
AST: 24 U/L (ref 0–37)
Albumin: 4.2 g/dL (ref 3.5–5.2)
Alkaline Phosphatase: 47 U/L (ref 39–117)
BILIRUBIN TOTAL: 0.5 mg/dL (ref 0.2–1.2)
BUN: 40 mg/dL — ABNORMAL HIGH (ref 6–23)
CALCIUM: 9.6 mg/dL (ref 8.4–10.5)
CHLORIDE: 104 meq/L (ref 96–112)
CO2: 27 mEq/L (ref 19–32)
Creatinine, Ser: 2.49 mg/dL — ABNORMAL HIGH (ref 0.40–1.50)
GFR: 27.41 mL/min — AB (ref >60.00)
GLUCOSE: 150 mg/dL — AB (ref 70–99)
Potassium: 4.4 mEq/L (ref 3.5–5.1)
Sodium: 139 mEq/L (ref 135–145)
Total Protein: 7.5 g/dL (ref 6.0–8.3)

## 2015-09-10 LAB — LIPID PANEL
CHOLESTEROL: 193 mg/dL (ref 0–200)
HDL: 34.2 mg/dL — ABNORMAL LOW (ref >39.00)
LDL Cholesterol: 123 mg/dL — ABNORMAL HIGH (ref 0–99)
NONHDL: 158.38
TRIGLYCERIDES: 176 mg/dL — AB (ref 0.0–149.0)
Total CHOL/HDL Ratio: 6
VLDL: 35.2 mg/dL (ref 0.0–40.0)

## 2015-09-10 LAB — POCT URINALYSIS DIPSTICK
Bilirubin, UA: NEGATIVE
Glucose, UA: NEGATIVE
Ketones, UA: NEGATIVE
Nitrite, UA: NEGATIVE
PH UA: 6.5
Spec Grav, UA: >=1.03
UROBILINOGEN UA: 0.2

## 2015-09-10 LAB — MICROALBUMIN / CREATININE URINE RATIO
CREATININE, U: 51.3 mg/dL
MICROALB/CREAT RATIO: 494.2 mg/g — AB (ref 0.0–30.0)
Microalb, Ur: 253.3 mg/dL — ABNORMAL HIGH (ref 0.0–1.9)

## 2015-09-10 LAB — HEMOGLOBIN A1C: Hgb A1c MFr Bld: 6.7 % — ABNORMAL HIGH (ref 4.6–6.5)

## 2015-09-10 MED ORDER — SULFAMETHOXAZOLE-TRIMETHOPRIM 800-160 MG PO TABS: 1.0000 | ORAL_TABLET | Freq: Two times a day (BID) | ORAL | Status: DC

## 2015-09-10 NOTE — Progress Notes (Signed)
OFFICE VISIT  09/10/2015   CC:  Chief Complaint  Patient presents with  . Follow-up    Pt is fasting.    HPI:    Patient is a 69 y.o. Caucasian male who presents for 6 mo f/u DM 2, CRI stage 3, hyperlipidemia, hx of elevated transaminases.  Last 1-2 d cloudy urine, mild lower abd cramps, mild dysuria: feels like his usual bladder infection starting.    Glucoses and insulin use variable as usual, activity varies, compliance with diet varies. Feet: no burning, tingling, or numbness.  Past Medical History  Diagnosis Date  . Diabetes mellitus     HbA1c 6.5% on 02/27/10.  Hx of 384 mg on 24h urine  . Bladder cancer ([place]) 1996    Remission  . Chronic renal insufficiency, stage III (moderate)     Borderline stage 4 (CrCl @30 ).  Obstructive uropathy + medicorenal dz:  R>L hydroureteronephrosis 2009; no mass , just chronic poor emptying of neobladder) + diabetic nephropathy  . Hyperlipidemia     Hypertriglyceridemia (on and off meds over the years).  Statin intolerant  . ED (erectile dysfunction)   . Elevated transaminase level 2013    Abd u/s nl; viral hep serologies neg  . 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 Cystectomy  1997    with continent urinary reservoir (ileal loop) and artificial urinary sphincter placement-[facility]   . Cardiovascular stress test  04/2013    Normal myocardial perfusion stress test.    Outpatient Prescriptions Prior to Visit  Medication Sig Dispense Refill  . aspirin 81 MG tablet Take 81 mg by mouth daily.      . insulin glargine (LANTUS) 100 UNIT/ML injection Inject 70 Units into the skin at bedtime. DX: 250.00 30 mL prn  . insulin lispro (HUMALOG) 100 UNIT/ML injection INJECT 20-30 UNITS INTO THE SKIN 3 (THREE) TIMES DAILY BEFORE MEALS 3 vial 1  . insulin NPH Human (HUMULIN N,NOVOLIN N) 100 UNIT/ML injection 40-50 units SQ 1-2 times per day 60 mL 11  . Insulin Pen Needle 32G X 6 MM MISC Use for your pen insulin  injection 4 times per day 100 each 10  . Insulin Syringe-Needle U-100 (BD INSULIN SYRINGE ULTRAFINE) 31G X 5/16" 1 ML MISC Use as directed to administer your 70/30 insulin.  DX: 250.00 100 each prn  . losartan (COZAAR) 50 MG tablet TAKE 1 TABLET BY MOUTH DAILY. 90 tablet 4  . Multiple Vitamin (MULTIVITAMIN) tablet Take 1 tablet by mouth daily.      Marland Kitchen sulfamethoxazole-trimethoprim (BACTRIM DS,SEPTRA DS) 800-160 MG per tablet Take 1 tablet by mouth 2 (two) times daily. 10 tablet 2   No facility-administered medications prior to visit.    No Known Allergies  ROS As per HPI  PE: Blood pressure 110/75, pulse 109, temperature 98.5 F (36.9 C), temperature source Oral, resp. rate 16, height 6' (1.829 m), weight 238 lb (107.956 kg), SpO2 92 %. Gen: Alert, well appearing.  Patient is oriented to person, place, time, and situation. Foot exam - both normal; no swelling, tenderness or skin or vascular lesions. Color and temperature is normal. Sensation is intact. Peripheral pulses are palpable. Toenails are normal.   LABS:  Lab Results  Component Value Date   TSH 0.63 08/24/2011   Lab Results  Component Value Date   WBC 4.2* 10/31/2012   HGB 13.4 10/31/2012   HCT 39.5 10/31/2012   MCV 90.4 10/31/2012   PLT 151.0 10/31/2012  Lab Results  Component Value Date   CREATININE 1.93* 03/13/2015   BUN 24* 03/13/2015   NA 136 03/13/2015   K 4.5 03/13/2015   CL 102 03/13/2015   CO2 29 03/13/2015   Lab Results  Component Value Date   ALT 33 03/13/2015   AST 33 03/13/2015   ALKPHOS 50 03/13/2015   BILITOT 0.5 03/13/2015   Lab Results  Component Value Date   CHOL 156 03/14/2014   Lab Results  Component Value Date   HDL 32.80* 03/14/2014   Lab Results  Component Value Date   LDLCALC 104* 03/14/2014   Lab Results  Component Value Date   TRIG 97.0 03/14/2014   Lab Results  Component Value Date   CHOLHDL 5 03/14/2014   Lab Results  Component Value Date   HGBA1C 6.3  03/13/2015   CC UA today: large LEU, mod blood, 100 mg/dl protein  IMPRESSION AND PLAN:  1) DM 2, insulin-requiring intermittently. Pt does a lot of self-management tactics that I am unable to talk sense to him about (namely, "chasing sugars"). HbA1c today.  Feet exam today wnl.  Urine microalb/cr today. Pt has been reminded every visit that he needs updated diab retpthy screening exam.  2) Hx of UTI, currently has signs of one today: started bactrim already today, new rx's for this sent in b/c he has need for this 2-3 times per year.  These come when he has temporary inadequate emptying/stagnation of urine from his neobladder.   Sent urine for c/s today.  3) CRI, stage III:  Obstructive uropathy + medicorenal dz. Has hx of bladder ca with resection and surveillance f/u at [facility]. Has also seen [place] nephrology and pt says all was stable at most recent f/u with them about 6 mo ago and he says he is not going to go back to them unless our labs show his renal function declining significantly.  4) Hyperlipidemia: lipids have been fair but statin still indicated.  However, pt had intolerance to statin trial x 1 and refuses further trial of statin.  Recheck lipids today.  5) Hx of elevated transaminases, likely from fatty liver. Recheck CMET today.  An After Visit Summary was printed and given to the patient.  FOLLOW UP: Return in about 6 months (around 03/09/2016) for routine chronic illness f/u.

## 2015-09-10 NOTE — Progress Notes (Signed)
Pre visit review using our clinic review tool, if applicable. No additional management support is needed unless otherwise documented below in the visit note. 

## 2015-09-11 LAB — URINE CULTURE
COLONY COUNT: NO GROWTH
Organism ID, Bacteria: NO GROWTH

## 2015-09-12 ENCOUNTER — Other Ambulatory Visit: Payer: Self-pay | Admitting: *Deleted

## 2015-09-12 ENCOUNTER — Encounter: Payer: Self-pay | Admitting: Family Medicine

## 2015-09-12 DIAGNOSIS — N183 Chronic kidney disease, stage 3 unspecified: Secondary | ICD-10-CM

## 2015-09-12 DIAGNOSIS — IMO0001 Reserved for inherently not codable concepts without codable children: Secondary | ICD-10-CM

## 2015-10-25 ENCOUNTER — Encounter: Payer: Self-pay | Admitting: Family Medicine

## 2015-10-25 ENCOUNTER — Ambulatory Visit (INDEPENDENT_AMBULATORY_CARE_PROVIDER_SITE_OTHER): Payer: Medicare PPO | Admitting: Family Medicine

## 2015-10-25 VITALS — BP 98/68 | HR 100 | Temp 98.0°F | Resp 20 | Wt 238.5 lb

## 2015-10-25 DIAGNOSIS — N183 Chronic kidney disease, stage 3 unspecified: Secondary | ICD-10-CM

## 2015-10-25 DIAGNOSIS — IMO0001 Reserved for inherently not codable concepts without codable children: Secondary | ICD-10-CM

## 2015-10-25 DIAGNOSIS — R809 Proteinuria, unspecified: Secondary | ICD-10-CM | POA: Diagnosis not present

## 2015-10-25 DIAGNOSIS — Z87440 Personal history of urinary (tract) infections: Secondary | ICD-10-CM

## 2015-10-25 DIAGNOSIS — Z794 Long term (current) use of insulin: Secondary | ICD-10-CM

## 2015-10-25 DIAGNOSIS — E118 Type 2 diabetes mellitus with unspecified complications: Secondary | ICD-10-CM | POA: Diagnosis not present

## 2015-10-25 LAB — POC URINALSYSI DIPSTICK (AUTOMATED)
BILIRUBIN UA: NEGATIVE
Glucose, UA: NEGATIVE
Ketones, UA: NEGATIVE
NITRITE UA: NEGATIVE
SPEC GRAV UA: 1.025
UROBILINOGEN UA: 0.2
pH, UA: 6

## 2015-10-25 LAB — MICROALBUMIN / CREATININE URINE RATIO
Creatinine,U: 59.9 mg/dL
Microalb Creat Ratio: 140.3 mg/g — ABNORMAL HIGH (ref 0.0–30.0)
Microalb, Ur: 84 mg/dL — ABNORMAL HIGH (ref 0.0–1.9)

## 2015-10-25 LAB — BASIC METABOLIC PANEL
BUN: 45 mg/dL — AB (ref 6–23)
CO2: 25 mEq/L (ref 19–32)
Calcium: 10.5 mg/dL (ref 8.4–10.5)
Chloride: 103 mEq/L (ref 96–112)
Creatinine, Ser: 2.3 mg/dL — ABNORMAL HIGH (ref 0.40–1.50)
GFR: 30.03 mL/min — AB (ref >60.00)
GLUCOSE: 151 mg/dL — AB (ref 70–99)
Potassium: 4.5 mEq/L (ref 3.5–5.1)
Sodium: 138 mEq/L (ref 135–145)

## 2015-10-25 NOTE — Progress Notes (Signed)
OFFICE VISIT  10/25/2015   CC:  Chief Complaint  Patient presents with  . Follow-up    re check urine     HPI:    Patient is a 69 y.o. Caucasian male who presents for f/u DM 2 with proteinuria. Last visit about 6-7 wks ago his urine microalb/cr was quite elevated compared to prior checks and his Cr was up to 2.5 so I asked him to focus more on good hydration and return for recheck BMET and urine microalb/cr today.  If these labs don't improve on this check I have already talked to him about getting back in to see his nephrologist at [facility].  After last visit he still felt urinary urgency, suprapubic cramping, and fowl odor of urine so he took cipro for a few days and feels like all sx's resolved at this time (Bactrim did not help this time).  Says he's hydrating well, also trying to minimize protein intake some.   He does have some night-time leakage of urine from his "check valve" on bladder--bringing up the question of whether or not he is getting backed up with urine overnight (leading to increased risk of obstructive uropathy). During daytime he empties fine.  Past Medical History  Diagnosis Date  . Diabetes mellitus     HbA1c 6.5% on 02/27/10.  Hx of 384 mg on 24h urine  . Bladder cancer ([place]) 1996    Remission  . Chronic renal insufficiency, stage III (moderate)     Borderline stage 4 (CrCl @30 ).  Obstructive uropathy + medicorenal dz:  R>L hydroureteronephrosis 2009; no mass , just chronic poor emptying of neobladder) + diabetic nephropathy  . Hyperlipidemia     Hypertriglyceridemia (on and off meds over the years).  Statin intolerant  . ED (erectile dysfunction)   . Elevated transaminase level 2013    Abd u/s nl; viral hep serologies neg  . 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 Type 2 diabetes mellitus with proteinuria [facility])     Past Surgical History  Procedure Laterality Date  . Cystectomy  1997    with continent urinary reservoir (ileal loop) and artificial urinary sphincter  placement-[facility]   . Cardiovascular stress test  04/2013    Normal myocardial perfusion stress test.    Outpatient Prescriptions Prior to Visit  Medication Sig Dispense Refill  . aspirin 81 MG tablet Take 81 mg by mouth daily.      . insulin glargine (LANTUS) 100 UNIT/ML injection Inject 70 Units into the skin at bedtime. DX: 250.00 30 mL prn  . insulin lispro (HUMALOG) 100 UNIT/ML injection INJECT 20-30 UNITS INTO THE SKIN 3 (THREE) TIMES DAILY BEFORE MEALS 3 vial 1  . insulin NPH Human (HUMULIN N,NOVOLIN N) 100 UNIT/ML injection 40-50 units SQ 1-2 times per day 60 mL 11  . Insulin Pen Needle 32G X 6 MM MISC Use for your pen insulin injection 4 times per day 100 each 10  . Insulin Syringe-Needle U-100 (BD INSULIN SYRINGE ULTRAFINE) 31G X 5/16" 1 ML MISC Use as directed to administer your 70/30 insulin.  DX: 250.00 100 each prn  . losartan (COZAAR) 50 MG tablet TAKE 1 TABLET BY MOUTH DAILY. 90 tablet 4  . Multiple Vitamin (MULTIVITAMIN) tablet Take 1 tablet by mouth daily.      Marland Kitchen sulfamethoxazole-trimethoprim (BACTRIM DS,SEPTRA DS) 800-160 MG tablet Take 1 tablet by mouth 2 (two) times daily. 10 tablet 4   No facility-administered medications prior to visit.    Allergies  Allergen Reactions  .  Lisinopril Cough  . Morphine     Other reaction(s): Hallucination    ROS As per HPI  PE: Blood pressure 98/68, pulse 100, temperature 98 F (36.7 C), resp. rate 20, weight 238 lb 8 oz (108.183 kg), SpO2 96 %. Gen: Alert, well appearing.  Patient is oriented to person, place, time, and situation. AFFECT: pleasant, lucid thought and speech. No further exam today.  LABS:  CC UA today: large blood, protein >300mg /dl, large LEU, otherwise normal.  IMPRESSION AND PLAN:  1) Chronic renal insufficiency stage 3, with proteinuria.  Cr and microalbuminuria both worse at last check about 6-7 wks ago.  Will recheck these today and I told him I recommend he get back with his nephrologist at [facility] (Dr.  Gustavo Lah with [facility] nephrology).  He is hesitant to do this but I think I talked him into complying with this recommendation today.  He feels more strongly about f/u with his urologist at [facility] regarding getting his artificial urinary sphincter checked out. We discussed the fact that he may need a renal u/s to look for hydronephrosis, but we'll leave that decision to the specialists at [facility] since pt lives in [facility] now and I cannot arrange anything like this locally for him now b/c he is going straight back to [facility] now.    Spent 30 min with pt today, with >50% of this time spent in counseling and care coordination regarding the above problems.  An After Visit Summary was printed and given to the patient.  FOLLOW UP:  Keep appt scheduled for 03/10/16

## 2015-10-26 LAB — URINE CULTURE
COLONY COUNT: NO GROWTH
Organism ID, Bacteria: NO GROWTH

## 2015-11-01 ENCOUNTER — Encounter: Payer: Self-pay | Admitting: Family Medicine

## 2015-11-01 DIAGNOSIS — R8290 Unspecified abnormal findings in urine: Secondary | ICD-10-CM | POA: Insufficient documentation

## 2015-12-31 ENCOUNTER — Telehealth: Payer: Self-pay | Admitting: Family Medicine

## 2015-12-31 MED ORDER — LOSARTAN POTASSIUM 50 MG PO TABS: ORAL_TABLET | ORAL | Status: DC

## 2015-12-31 NOTE — Telephone Encounter (Signed)
Pt states that he was working on a house yesterday and has pulled a muscle in his shoulder. Pt is asking for a muscle relaxer and pain something for pain. Also pt asking for a refill on BP meds and RX be sent to the [facility] in [facility]. Pt states that if all poss to please not wait until later this afternoon to send this in.

## 2015-12-31 NOTE — Telephone Encounter (Signed)
Pt advised and voiced understanding. He stated that he will just deal with the pain because he is not going to see another doctor, that Dr. Anitra Lauth is his doctor and he can not drive [phone] miles to be seen today.

## 2015-12-31 NOTE — Telephone Encounter (Signed)
I have sent in refill for Losartan.  RF request for losartan LOV: 10/25/15 Next ov: 03/10/16 Last written: 09/12/14 #90 w/ 4RF  Please review the message below and advise. Thanks.

## 2015-12-31 NOTE — Telephone Encounter (Signed)
Pls notify pt that he will have to see a local MD to get muscle relaxer and/or pain medication. I can't do these rx's w/out seeing him for this problem.  Sorry-thx

## 2016-03-10 ENCOUNTER — Ambulatory Visit: Payer: Medicare PPO | Admitting: Family Medicine

## 2016-03-25 ENCOUNTER — Encounter: Payer: Self-pay | Admitting: Family Medicine

## 2016-03-25 ENCOUNTER — Ambulatory Visit (INDEPENDENT_AMBULATORY_CARE_PROVIDER_SITE_OTHER): Payer: Medicare PPO | Admitting: Family Medicine

## 2016-03-25 VITALS — BP 102/71 | HR 70 | Temp 98.1°F | Resp 16 | Ht 72.0 in | Wt 208.5 lb

## 2016-03-25 DIAGNOSIS — N183 Chronic kidney disease, stage 3 unspecified: Secondary | ICD-10-CM

## 2016-03-25 DIAGNOSIS — M7582 Other shoulder lesions, left shoulder: Secondary | ICD-10-CM

## 2016-03-25 DIAGNOSIS — Z794 Long term (current) use of insulin: Secondary | ICD-10-CM | POA: Diagnosis not present

## 2016-03-25 DIAGNOSIS — E118 Type 2 diabetes mellitus with unspecified complications: Secondary | ICD-10-CM

## 2016-03-25 DIAGNOSIS — E782 Mixed hyperlipidemia: Secondary | ICD-10-CM | POA: Diagnosis not present

## 2016-03-25 LAB — COMPREHENSIVE METABOLIC PANEL
ALT: 36 U/L (ref 0–53)
AST: 29 U/L (ref 0–37)
Albumin: 3.9 g/dL (ref 3.5–5.2)
Alkaline Phosphatase: 61 U/L (ref 39–117)
BUN: 38 mg/dL — ABNORMAL HIGH (ref 6–23)
CALCIUM: 9.6 mg/dL (ref 8.4–10.5)
CHLORIDE: 107 meq/L (ref 96–112)
CO2: 28 mEq/L (ref 19–32)
Creatinine, Ser: 2.11 mg/dL — ABNORMAL HIGH (ref 0.40–1.50)
GFR: 33.13 mL/min — AB (ref >60.00)
Glucose, Bld: 109 mg/dL — ABNORMAL HIGH (ref 70–99)
Potassium: 4.8 mEq/L (ref 3.5–5.1)
Sodium: 139 mEq/L (ref 135–145)
Total Bilirubin: 0.4 mg/dL (ref 0.2–1.2)
Total Protein: 7.8 g/dL (ref 6.0–8.3)

## 2016-03-25 LAB — LIPID PANEL
CHOLESTEROL: 156 mg/dL (ref 0–200)
HDL: 31.2 mg/dL — AB (ref >39.00)
LDL CALC: 106 mg/dL — AB (ref 0–99)
NonHDL: 125.11
TRIGLYCERIDES: 94 mg/dL (ref 0.0–149.0)
Total CHOL/HDL Ratio: 5
VLDL: 18.8 mg/dL (ref 0.0–40.0)

## 2016-03-25 LAB — HEMOGLOBIN A1C: Hgb A1c MFr Bld: 7.4 % — ABNORMAL HIGH (ref 4.6–6.5)

## 2016-03-25 NOTE — Progress Notes (Signed)
Pre visit review using our clinic review tool, if applicable. No additional management support is needed unless otherwise documented below in the visit note. 

## 2016-03-25 NOTE — Progress Notes (Signed)
OFFICE VISIT  03/25/2016   CC:  Chief Complaint  Patient presents with  . Follow-up    Pt is fasting.     HPI:    Patient is a 70 y.o. Caucasian male who presents for 6 mo f/u DM 2, history of elevated transaminase levels, and CRI stage III.  He has mixed hyperlipidemia but after a bad experience with statin side effects he has decided never to try a statin again.  Checks gluc about once a day.  Bedtime--140-150.   Takes 70 U of Relion N once a day, 20-30 of humalog each meal.   Not taking lantus.  Nutrisystem diet lately, determined to get wt off and keep it off: goal wt 190-195.  Says shoulder is bothering him.  Left shoulder, 3 months now, recalls lifting a sheet of plywood and felt something "rip".  Hurts with aBduction and external rotation.  No neck pain.  No radicular pain or paresthesias.  Past Medical History  Diagnosis Date  . Diabetes mellitus     HbA1c 6.5% on 02/27/10.  Hx of 384 mg on 24h urine  . Bladder cancer ([place]) 1996    Remission  . Chronic renal insufficiency, stage III (moderate)     Borderline stage 4 (CrCl @30 ).  Obstructive uropathy + medicorenal dz:  R>L hydroureteronephrosis 2009; no mass , just chronic poor emptying of neobladder) + diabetic nephropathy  . Hyperlipidemia     Hypertriglyceridemia (on and off meds over the years).  Statin intolerant  . ED (erectile dysfunction)   . Elevated transaminase level 2013    Abd u/s nl; viral hep serologies neg  . 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 Type 2 diabetes mellitus with proteinuria [facility])     Past Surgical History  Procedure Laterality Date  . Cystectomy  1997    with continent urinary reservoir (ileal loop) and artificial urinary sphincter placement-[facility]   . Cardiovascular stress test  04/2013    Normal myocardial perfusion stress test.    Outpatient Prescriptions Prior to Visit  Medication Sig Dispense Refill  . aspirin 81 MG tablet Take 81 mg by mouth daily.      . insulin glargine (LANTUS) 100  UNIT/ML injection Inject 70 Units into the skin at bedtime. DX: 250.00 30 mL prn  . insulin lispro (HUMALOG) 100 UNIT/ML injection INJECT 20-30 UNITS INTO THE SKIN 3 (THREE) TIMES DAILY BEFORE MEALS 3 vial 1  . Insulin Pen Needle 32G X 6 MM MISC Use for your pen insulin injection 4 times per day 100 each 10  . Insulin Syringe-Needle U-100 (BD INSULIN SYRINGE ULTRAFINE) 31G X 5/16" 1 ML MISC Use as directed to administer your 70/30 insulin.  DX: 250.00 100 each prn  . losartan (COZAAR) 50 MG tablet TAKE 1 TABLET BY MOUTH DAILY. 90 tablet 3  . Multiple Vitamin (MULTIVITAMIN) tablet Take 1 tablet by mouth daily.      . insulin NPH Human (HUMULIN N,NOVOLIN N) 100 UNIT/ML injection 40-50 units SQ 1-2 times per day (Patient not taking: Reported on 03/25/2016) 60 mL 11   No facility-administered medications prior to visit.    Allergies  Allergen Reactions  . Lisinopril Cough  . Morphine     Other reaction(s): Hallucination    ROS As per HPI  PE: Blood pressure 102/71, pulse 70, temperature 98.1 F (36.7 C), temperature source Oral, resp. rate 16, height 6' (1.829 m), weight 208 lb 8 oz (94.575 kg), SpO2 95 %. Gen: Alert, well appearing.  Patient is oriented  to person, place, time, and situation. CV: RRR, no m/r/g.   LUNGS: CTA bilat, nonlabored resps, good aeration in all lung fields. EXT: no clubbing, cyanosis, or edema.  Left shoulder: minimal TTP around acromion hook.  Mild pain with aBduction and some impairment in aBduction in ROM at about 90 -160 deg.  Neg drop sign.  UE strength 5/5 prox/dist bilat.  No TTP of AC joint.  LABS:  Lab Results  Component Value Date   TSH 0.63 08/24/2011   Lab Results  Component Value Date   WBC 4.2* 10/31/2012   HGB 13.4 10/31/2012   HCT 39.5 10/31/2012   MCV 90.4 10/31/2012   PLT 151.0 10/31/2012   Lab Results  Component Value Date   CREATININE 2.30* 10/25/2015   BUN 45* 10/25/2015   NA 138 10/25/2015   K 4.5 10/25/2015   CL 103  10/25/2015   CO2 25 10/25/2015   Lab Results  Component Value Date   ALT 24 09/10/2015   AST 24 09/10/2015   ALKPHOS 47 09/10/2015   BILITOT 0.5 09/10/2015   Lab Results  Component Value Date   CHOL 193 09/10/2015   Lab Results  Component Value Date   HDL 34.20* 09/10/2015   Lab Results  Component Value Date   LDLCALC 123* 09/10/2015   Lab Results  Component Value Date   TRIG 176.0* 09/10/2015   Lab Results  Component Value Date   CHOLHDL 6 09/10/2015   Lab Results  Component Value Date   HGBA1C 6.7* 09/10/2015   IMPRESSION AND PLAN:  1) Rotator cuff tendonitis, left: encouraged pt to continue to take conservative approach to this. Relative rest, ROM exercises, and prn tylenol.  2) DM 2: good control historically. He messes around with his insulin dosing a lot, as usual. Hba1c today.  3) CRI stage III--combo of medicorenal dz and obstructive uropathy: Avoid NSAIDs.  He is on low dose ARB.   Check BMET today. Urine microalbumin not informative in him since he has a neobladder made from his GI tract.  4) HLD, mixed: pt statin intolerant.  However, he is trying hard with TLC at this time. Check FLP today.  An After Visit Summary was printed and given to the patient.  FOLLOW UP: Return in about 6 months (around 09/25/2016) for annual CPE (fasting).  Signed:  Crissie Sickles, MD           03/25/2016

## 2016-04-10 ENCOUNTER — Other Ambulatory Visit: Payer: Self-pay | Admitting: *Deleted

## 2016-04-10 MED ORDER — INSULIN GLARGINE 100 UNIT/ML ~~LOC~~ SOLN: 74.0000 [IU] | Freq: Every day | SUBCUTANEOUS | Status: DC

## 2016-04-10 NOTE — Telephone Encounter (Signed)
Pt LMOM on 04/09/16 at 4:22pm requesting refill for Lantus.   RF request for lantus LOV: 03/25/16 Next ov: 10/23/16 Last written: 06/30/12 #66ml w/ prnRF  Rx sent for Lantus #30ml w/ 6RF. Pt advised and voiced understanding.

## 2016-04-17 ENCOUNTER — Other Ambulatory Visit: Payer: Self-pay | Admitting: Family Medicine

## 2016-04-17 ENCOUNTER — Telehealth: Payer: Self-pay | Admitting: Family Medicine

## 2016-04-17 NOTE — Telephone Encounter (Signed)
Called pharmacy and had them change Rx to Relion N per pts request. Tried calling pt NA and unable to leave a message.

## 2016-04-17 NOTE — Telephone Encounter (Signed)
Patient left VM on front desk phone asking for Lantus to be changed to Relion due to cost. He uses [facility] in [place] phone [phone].

## 2016-04-21 NOTE — Telephone Encounter (Signed)
Pts wife advised and voiced understanding, okay per DPR. 

## 2016-05-12 ENCOUNTER — Encounter: Payer: Self-pay | Admitting: Family Medicine

## 2016-05-12 LAB — HM DIABETES EYE EXAM

## 2016-09-16 ENCOUNTER — Ambulatory Visit (INDEPENDENT_AMBULATORY_CARE_PROVIDER_SITE_OTHER): Payer: Medicare PPO | Admitting: Family Medicine

## 2016-09-16 ENCOUNTER — Encounter: Payer: Self-pay | Admitting: Family Medicine

## 2016-09-16 VITALS — BP 94/67 | HR 85 | Temp 98.8°F | Resp 16 | Wt 201.0 lb

## 2016-09-16 DIAGNOSIS — N183 Chronic kidney disease, stage 3 unspecified: Secondary | ICD-10-CM

## 2016-09-16 DIAGNOSIS — M25512 Pain in left shoulder: Secondary | ICD-10-CM

## 2016-09-16 DIAGNOSIS — E7800 Pure hypercholesterolemia, unspecified: Secondary | ICD-10-CM

## 2016-09-16 DIAGNOSIS — G8929 Other chronic pain: Secondary | ICD-10-CM

## 2016-09-16 DIAGNOSIS — M7502 Adhesive capsulitis of left shoulder: Secondary | ICD-10-CM

## 2016-09-16 DIAGNOSIS — E118 Type 2 diabetes mellitus with unspecified complications: Secondary | ICD-10-CM | POA: Diagnosis not present

## 2016-09-16 DIAGNOSIS — N2889 Other specified disorders of kidney and ureter: Secondary | ICD-10-CM

## 2016-09-16 LAB — BASIC METABOLIC PANEL
BUN: 42 mg/dL — AB (ref 6–23)
CO2: 24 mEq/L (ref 19–32)
CREATININE: 2.53 mg/dL — AB (ref 0.40–1.50)
Calcium: 9.3 mg/dL (ref 8.4–10.5)
Chloride: 104 mEq/L (ref 96–112)
GFR: 26.83 mL/min — ABNORMAL LOW (ref >60.00)
Glucose, Bld: 148 mg/dL — ABNORMAL HIGH (ref 70–99)
Potassium: 4.6 mEq/L (ref 3.5–5.1)
Sodium: 137 mEq/L (ref 135–145)

## 2016-09-16 LAB — HEMOGLOBIN A1C: HEMOGLOBIN A1C: 7.3 % — AB (ref 4.6–6.5)

## 2016-09-16 MED ORDER — LOSARTAN POTASSIUM 50 MG PO TABS: ORAL_TABLET | ORAL | 3 refills | Status: DC

## 2016-09-16 NOTE — Progress Notes (Signed)
+OFFICE VISIT  09/16/2016   CC: F/u DM, etc  HPI:    Patient is a 70 y.o. Caucasian male who presents for 6 mo f/u DM 2, CRI stage III (GFR 30s), HLD. Doing great with diet, losing wt.  Relion N: average dose is 50-60 U once daily.  Not taking mealtime insulin for the last year.  Has some chronic L shoulder discomfort when he lifts it high, hx of rotator cuff impingement/tendonitis in the past.  Hurt it when building a house last year.    He avoids NSAIDs.     HLD: he doesn't take a statin and does not want to take one.  Past Medical History:  Diagnosis Date  . Bladder cancer ([place]) 1996   Remission  . Chronic renal insufficiency, stage III (moderate)    Borderline stage 4 (CrCl @30 ).  Obstructive uropathy + medicorenal dz:  R>L hydroureteronephrosis 2009; no mass , just chronic poor emptying of neobladder) + diabetic nephropathy  . Diabetes mellitus    HbA1c 6.5% on 02/27/10.  Hx of 384 mg on 24h urine  . ED (erectile dysfunction)   . Elevated transaminase level 2013   Abd u/s nl; viral hep serologies neg  . Hyperlipidemia    Hypertriglyceridemia (on and off meds over the years).  Statin intolerant  . 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 Type 2 diabetes mellitus with proteinuria [facility])     Past Surgical History:  Procedure Laterality Date  . CARDIOVASCULAR STRESS TEST  04/2013   Normal myocardial perfusion stress test.  . CYSTECTOMY  1997   with continent urinary reservoir (ileal loop) and artificial urinary sphincter placement-[facility]     Outpatient Medications Prior to Visit  Medication Sig Dispense Refill  . aspirin 81 MG tablet Take 81 mg by mouth daily.      . insulin glargine (LANTUS) 100 UNIT/ML injection Inject 0.74 mLs (74 Units total) into the skin at bedtime. DX: E11.8 30 mL 6  . insulin lispro (HUMALOG) 100 UNIT/ML injection INJECT 20-30 UNITS INTO THE SKIN 3 (THREE) TIMES DAILY BEFORE MEALS 3 vial 1  . Insulin Pen Needle 32G X 6 MM MISC Use for your pen insulin  injection 4 times per day 100 each 10  . Insulin Syringe-Needle U-100 (BD INSULIN SYRINGE ULTRAFINE) 31G X 5/16" 1 ML MISC Use as directed to administer your 70/30 insulin.  DX: 250.00 100 each prn  . Multiple Vitamin (MULTIVITAMIN) tablet Take 1 tablet by mouth daily.      Marland Kitchen losartan (COZAAR) 50 MG tablet TAKE 1 TABLET BY MOUTH DAILY. 90 tablet 3   No facility-administered medications prior to visit.     Allergies  Allergen Reactions  . Lisinopril Cough  . Morphine     Other reaction(s): Hallucination    ROS As per HPI  PE: Blood pressure 94/67, pulse 85, temperature 98.8 F (37.1 C), temperature source Temporal, resp. rate 16, weight 201 lb (91.2 kg), SpO2 97 %. Gen: Alert, well appearing.  Patient is oriented to person, place, time, and situation. AFFECT: pleasant, lucid thought and speech. Left shoulder: limited ABduction and ER, +impingement sign. +Obrien's on L. UE strength intact.  LABS:  Lab Results  Component Value Date   TSH 0.63 08/24/2011   Lab Results  Component Value Date   WBC 4.2 (L) 10/31/2012   HGB 13.4 10/31/2012   HCT 39.5 10/31/2012   MCV 90.4 10/31/2012   PLT 151.0 10/31/2012   Lab Results  Component Value Date   CREATININE 2.11 (  H) 03/25/2016   BUN 38 (H) 03/25/2016   NA 139 03/25/2016   K 4.8 03/25/2016   CL 107 03/25/2016   CO2 28 03/25/2016   Lab Results  Component Value Date   ALT 36 03/25/2016   AST 29 03/25/2016   ALKPHOS 61 03/25/2016   BILITOT 0.4 03/25/2016   Lab Results  Component Value Date   CHOL 156 03/25/2016   Lab Results  Component Value Date   HDL 31.20 (L) 03/25/2016   Lab Results  Component Value Date   LDLCALC 106 (H) 03/25/2016   Lab Results  Component Value Date   TRIG 94.0 03/25/2016   Lab Results  Component Value Date   CHOLHDL 5 03/25/2016   Lab Results  Component Value Date   HGBA1C 7.4 (H) 03/25/2016    IMPRESSION AND PLAN:  1) DM 2; minimal insulin use lately. Good diet/wt  loss. Check A1c today.  2) CRI stage 3: combo of longterm obstructive uropathy + medical renal dz: monitor BMET today. RF cozaar 50mg  qd. Avoid NSAIDs.  3) Chronic L shoulder pain: this is gradually improving.  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  He is not interested in imaging or ortho referral at this time. I recommended PT as first line tx and he deferred this for now since it is gradually getting better. We discussed some home exercises/stretches he can do.  4) Hyperlipidemia: hx of statin intolerance.  He declines any further statin trial.  An After Visit Summary was printed and given to the patient.  FOLLOW UP: Return in about 6 months (around 03/16/2017).  Signed:  Crissie Sickles, MD           09/16/2016

## 2016-09-23 ENCOUNTER — Other Ambulatory Visit: Payer: Self-pay | Admitting: *Deleted

## 2016-09-23 MED ORDER — GLUCOSE BLOOD VI STRP: ORAL_STRIP | 3 refills | Status: DC

## 2016-09-23 MED ORDER — ACCU-CHEK FASTCLIX LANCETS MISC: 3 refills | Status: DC

## 2016-09-23 NOTE — Telephone Encounter (Signed)
Pts wife called requesting refill for lancets and test strips to be sent to [facility]. She stated that pt test 2 x daily.   Spoke to [place] at [facility] and she stated that pt gets the [employer] view test strips and Accu-chek fast click lancets.   Rx sent. Pts wife advised and voiced understanding.

## 2016-10-23 ENCOUNTER — Ambulatory Visit: Payer: Medicare PPO | Admitting: Family Medicine

## 2017-03-08 DIAGNOSIS — I10 Essential (primary) hypertension: Secondary | ICD-10-CM | POA: Diagnosis not present

## 2017-03-08 DIAGNOSIS — Z7984 Long term (current) use of oral hypoglycemic drugs: Secondary | ICD-10-CM | POA: Diagnosis not present

## 2017-03-08 DIAGNOSIS — E782 Mixed hyperlipidemia: Secondary | ICD-10-CM | POA: Diagnosis not present

## 2017-03-08 DIAGNOSIS — E119 Type 2 diabetes mellitus without complications: Secondary | ICD-10-CM | POA: Diagnosis not present

## 2017-03-24 DIAGNOSIS — I10 Essential (primary) hypertension: Secondary | ICD-10-CM | POA: Diagnosis not present

## 2017-06-09 DIAGNOSIS — E119 Type 2 diabetes mellitus without complications: Secondary | ICD-10-CM | POA: Diagnosis not present

## 2017-06-09 DIAGNOSIS — I10 Essential (primary) hypertension: Secondary | ICD-10-CM | POA: Diagnosis not present

## 2017-06-09 DIAGNOSIS — E8809 Other disorders of plasma-protein metabolism, not elsewhere classified: Secondary | ICD-10-CM | POA: Diagnosis not present

## 2017-06-09 DIAGNOSIS — R718 Other abnormality of red blood cells: Secondary | ICD-10-CM | POA: Diagnosis not present

## 2017-06-09 DIAGNOSIS — D649 Anemia, unspecified: Secondary | ICD-10-CM | POA: Diagnosis not present

## 2017-06-09 DIAGNOSIS — C679 Malignant neoplasm of bladder, unspecified: Secondary | ICD-10-CM | POA: Diagnosis not present

## 2017-06-21 DIAGNOSIS — C679 Malignant neoplasm of bladder, unspecified: Secondary | ICD-10-CM | POA: Diagnosis not present

## 2017-09-10 DIAGNOSIS — I10 Essential (primary) hypertension: Secondary | ICD-10-CM | POA: Diagnosis not present

## 2017-09-10 DIAGNOSIS — E119 Type 2 diabetes mellitus without complications: Secondary | ICD-10-CM | POA: Diagnosis not present

## 2017-09-10 DIAGNOSIS — D649 Anemia, unspecified: Secondary | ICD-10-CM | POA: Diagnosis not present

## 2017-09-10 DIAGNOSIS — M1990 Unspecified osteoarthritis, unspecified site: Secondary | ICD-10-CM | POA: Diagnosis not present

## 2017-09-10 DIAGNOSIS — N184 Chronic kidney disease, stage 4 (severe): Secondary | ICD-10-CM | POA: Diagnosis not present

## 2017-09-10 DIAGNOSIS — D494 Neoplasm of unspecified behavior of bladder: Secondary | ICD-10-CM | POA: Diagnosis not present

## 2017-09-10 DIAGNOSIS — R718 Other abnormality of red blood cells: Secondary | ICD-10-CM | POA: Diagnosis not present

## 2017-09-15 DIAGNOSIS — Z87891 Personal history of nicotine dependence: Secondary | ICD-10-CM | POA: Diagnosis not present

## 2017-09-15 DIAGNOSIS — N3945 Continuous leakage: Secondary | ICD-10-CM | POA: Diagnosis not present

## 2017-09-15 DIAGNOSIS — N393 Stress incontinence (female) (male): Secondary | ICD-10-CM | POA: Diagnosis not present

## 2017-09-28 DIAGNOSIS — N390 Urinary tract infection, site not specified: Secondary | ICD-10-CM | POA: Diagnosis not present

## 2017-10-08 DIAGNOSIS — N183 Chronic kidney disease, stage 3 (moderate): Secondary | ICD-10-CM | POA: Diagnosis not present

## 2017-10-08 DIAGNOSIS — Z9889 Other specified postprocedural states: Secondary | ICD-10-CM | POA: Diagnosis not present

## 2017-10-08 DIAGNOSIS — E1122 Type 2 diabetes mellitus with diabetic chronic kidney disease: Secondary | ICD-10-CM | POA: Diagnosis not present

## 2017-10-08 DIAGNOSIS — I129 Hypertensive chronic kidney disease with stage 1 through stage 4 chronic kidney disease, or unspecified chronic kidney disease: Secondary | ICD-10-CM | POA: Diagnosis not present

## 2017-10-08 DIAGNOSIS — Z6828 Body mass index (BMI) 28.0-28.9, adult: Secondary | ICD-10-CM | POA: Diagnosis not present

## 2017-10-08 DIAGNOSIS — Z794 Long term (current) use of insulin: Secondary | ICD-10-CM | POA: Diagnosis not present

## 2017-10-08 DIAGNOSIS — E669 Obesity, unspecified: Secondary | ICD-10-CM | POA: Diagnosis not present

## 2017-10-08 DIAGNOSIS — Z8545 Personal history of malignant neoplasm of unspecified male genital organ: Secondary | ICD-10-CM | POA: Diagnosis not present

## 2017-10-08 DIAGNOSIS — T83191A Other mechanical complication of urinary sphincter implant, initial encounter: Secondary | ICD-10-CM | POA: Diagnosis not present

## 2017-10-08 DIAGNOSIS — Z87891 Personal history of nicotine dependence: Secondary | ICD-10-CM | POA: Diagnosis not present

## 2017-10-08 DIAGNOSIS — Z79899 Other long term (current) drug therapy: Secondary | ICD-10-CM | POA: Diagnosis not present

## 2017-10-22 DIAGNOSIS — C679 Malignant neoplasm of bladder, unspecified: Secondary | ICD-10-CM | POA: Diagnosis not present

## 2017-12-20 DIAGNOSIS — N184 Chronic kidney disease, stage 4 (severe): Secondary | ICD-10-CM | POA: Diagnosis not present

## 2017-12-20 DIAGNOSIS — D649 Anemia, unspecified: Secondary | ICD-10-CM | POA: Diagnosis not present

## 2017-12-20 DIAGNOSIS — I10 Essential (primary) hypertension: Secondary | ICD-10-CM | POA: Diagnosis not present

## 2017-12-20 DIAGNOSIS — E119 Type 2 diabetes mellitus without complications: Secondary | ICD-10-CM | POA: Diagnosis not present

## 2018-01-03 DIAGNOSIS — E119 Type 2 diabetes mellitus without complications: Secondary | ICD-10-CM | POA: Diagnosis not present

## 2018-01-03 DIAGNOSIS — N390 Urinary tract infection, site not specified: Secondary | ICD-10-CM | POA: Diagnosis not present

## 2018-01-03 DIAGNOSIS — N189 Chronic kidney disease, unspecified: Secondary | ICD-10-CM | POA: Diagnosis not present

## 2018-01-03 DIAGNOSIS — Z125 Encounter for screening for malignant neoplasm of prostate: Secondary | ICD-10-CM | POA: Diagnosis not present

## 2018-05-03 DIAGNOSIS — I10 Essential (primary) hypertension: Secondary | ICD-10-CM | POA: Diagnosis not present

## 2018-05-03 DIAGNOSIS — N390 Urinary tract infection, site not specified: Secondary | ICD-10-CM | POA: Diagnosis not present

## 2018-05-03 DIAGNOSIS — E785 Hyperlipidemia, unspecified: Secondary | ICD-10-CM | POA: Diagnosis not present

## 2018-05-03 DIAGNOSIS — E119 Type 2 diabetes mellitus without complications: Secondary | ICD-10-CM | POA: Diagnosis not present

## 2018-05-03 DIAGNOSIS — Z794 Long term (current) use of insulin: Secondary | ICD-10-CM | POA: Diagnosis not present

## 2018-05-10 DIAGNOSIS — I10 Essential (primary) hypertension: Secondary | ICD-10-CM | POA: Diagnosis not present

## 2018-05-10 DIAGNOSIS — N189 Chronic kidney disease, unspecified: Secondary | ICD-10-CM | POA: Diagnosis not present

## 2018-05-10 DIAGNOSIS — N390 Urinary tract infection, site not specified: Secondary | ICD-10-CM | POA: Diagnosis not present

## 2018-05-10 DIAGNOSIS — G4700 Insomnia, unspecified: Secondary | ICD-10-CM | POA: Diagnosis not present

## 2018-05-10 DIAGNOSIS — E785 Hyperlipidemia, unspecified: Secondary | ICD-10-CM | POA: Diagnosis not present

## 2018-05-10 DIAGNOSIS — E1165 Type 2 diabetes mellitus with hyperglycemia: Secondary | ICD-10-CM | POA: Diagnosis not present

## 2018-08-16 DIAGNOSIS — N390 Urinary tract infection, site not specified: Secondary | ICD-10-CM | POA: Diagnosis not present

## 2018-08-16 DIAGNOSIS — N189 Chronic kidney disease, unspecified: Secondary | ICD-10-CM | POA: Diagnosis not present

## 2018-10-25 ENCOUNTER — Ambulatory Visit: Payer: Medicare PPO | Admitting: Family Medicine

## 2018-10-27 ENCOUNTER — Ambulatory Visit (INDEPENDENT_AMBULATORY_CARE_PROVIDER_SITE_OTHER): Payer: Medicare Other | Admitting: Family Medicine

## 2018-10-27 ENCOUNTER — Encounter: Payer: Self-pay | Admitting: Family Medicine

## 2018-10-27 VITALS — BP 125/74 | HR 73 | Temp 97.6°F | Resp 16 | Wt 243.0 lb

## 2018-10-27 DIAGNOSIS — G4700 Insomnia, unspecified: Secondary | ICD-10-CM

## 2018-10-27 DIAGNOSIS — E669 Obesity, unspecified: Secondary | ICD-10-CM | POA: Diagnosis not present

## 2018-10-27 DIAGNOSIS — N183 Chronic kidney disease, stage 3 unspecified: Secondary | ICD-10-CM

## 2018-10-27 DIAGNOSIS — E7800 Pure hypercholesterolemia, unspecified: Secondary | ICD-10-CM

## 2018-10-27 DIAGNOSIS — Z794 Long term (current) use of insulin: Secondary | ICD-10-CM | POA: Diagnosis not present

## 2018-10-27 DIAGNOSIS — E118 Type 2 diabetes mellitus with unspecified complications: Secondary | ICD-10-CM

## 2018-10-27 MED ORDER — ZOLPIDEM TARTRATE 10 MG PO TABS: 10.0000 mg | ORAL_TABLET | Freq: Every evening | ORAL | 5 refills | Status: DC | PRN

## 2018-10-27 MED ORDER — INSULIN REGULAR HUMAN 100 UNIT/ML IJ SOLN: INTRAMUSCULAR | 6 refills | Status: DC

## 2018-10-27 MED ORDER — INSULIN NPH (HUMAN) (ISOPHANE) 100 UNIT/ML ~~LOC~~ SUSP: SUBCUTANEOUS | 5 refills | Status: DC

## 2018-10-27 NOTE — Progress Notes (Signed)
Office Note 10/27/2018  CC:  Chief Complaint  Patient presents with  . Follow-up    RCI    HPI:  Alexander [name] is a 72 y.o. White male who is here to re-establish care and f/u DM 2, obesity, and CRI III. Patient's most recent primary MD: MD in [place], [place]-->no old records available at this time. Old records in EPIC/HL were reviewed prior to or during today's visit.  His health has been stable since I last saw him about 2 yrs ago.  DM 2: NPH and Regular:  70 U qPM and 30 regular --both of these after supper.  No hypoglycemia. Slight tingling in feet but not pain or numbness.  CRI III: avoids NSAIDs.  Hydrates pretty well.  Obesity: working on drinking more water, portion size is "too much" and he avoids no specific foods. He has a goal wt of 200 lbs. Wants to start dieting and biking.  Insomnia: has been taking 5mg  ambien most nights but it has not been keeping  Him asleep. He has not tried taking 2 tabs.  No bladder infections since I last saw him!  Past Medical History:  Diagnosis Date  . Bladder cancer ([place]) 1996   Remission  . Chronic renal insufficiency, stage III (moderate) (HCC)    Borderline stage 4 (CrCl @30 ).  Obstructive uropathy + medicorenal dz:  R>L hydroureteronephrosis 2009; no mass , just chronic poor emptying of neobladder) + diabetic nephropathy  . Diabetes mellitus    HbA1c 6.5% on 02/27/10.  Hx of 384 mg on 24h urine  . ED (erectile dysfunction)   . Elevated transaminase level 2013   Abd u/s nl; viral hep serologies neg  . Hyperlipidemia    Hypertriglyceridemia (on and off meds over the years).  Statin intolerant  . 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 Type 2 diabetes mellitus with proteinuria [facility])     Past Surgical History:  Procedure Laterality Date  . CARDIOVASCULAR STRESS TEST  04/2013   Normal myocardial perfusion stress test.  . CYSTECTOMY  1997   with continent urinary reservoir (ileal loop) and artificial urinary sphincter  placement-[facility]   . URINARY SPHINCTER REVISION     [facility] 2018    History reviewed. No pertinent family history.  Social History   Socioeconomic History  . Marital status: Married    Spouse name: Not on file  . Number of children: Not on file  . Years of education: Not on file  . Highest education level: Not on file  Occupational History  . Not on file  Social Needs  . Financial resource strain: Not on file  . Food insecurity:    Worry: Not on file    Inability: Not on file  . Transportation needs:    Medical: Not on file    Non-medical: Not on file  Tobacco Use  . Smoking status: Former Smoker    Last attempt to quit: 11/02/1985    Years since quitting: 33.0  . Smokeless tobacco: Never Used  Substance and Sexual Activity  . Alcohol use: No  . Drug use: No  . Sexual activity: Not on file  Lifestyle  . Physical activity:    Days per week: Not on file    Minutes per session: Not on file  . Stress: Not on file  Relationships  . Social connections:    Talks on phone: Not on file    Gets together: Not on file    Attends religious service: Not on file  Active member of club or organization: Not on file    Attends meetings of clubs or organizations: Not on file    Relationship status: Not on file  . Intimate partner violence:    Fear of current or ex partner: Not on file    Emotionally abused: Not on file    Physically abused: Not on file    Forced sexual activity: Not on file  Other Topics Concern  . Not on file  Social History Narrative   Married, no biologic children but 2 adopted children from [place].   Retired Production manager, now in full time ministry/missionary.   Living in [place],  as of 10/2018.   No T/A/Ds.      Regular exercise--on and off.  Diet: highly variable.       Outpatient Encounter Medications as of 10/27/2018  Medication Sig  . [DISCONTINUED] zolpidem (AMBIEN) 10 MG tablet Take 10 mg by mouth at bedtime as needed for sleep.   Marland Kitchen ACCU-CHEK FASTCLIX LANCETS MISC Use to check blood sugar 2 times daily  . aspirin 81 MG tablet Take 81 mg by mouth daily.    . B Complex Vitamins (VITAMIN B COMPLEX PO) once daily.  Marland Kitchen glucose blood (ACCU-CHEK SMARTVIEW) test strip Use to check blood sugar 2 times daily  . insulin NPH Human (NOVOLIN N RELION) 100 UNIT/ML injection 70 U SQ q supper  . Insulin Pen Needle 32G X 6 MM MISC Use for your pen insulin injection 4 times per day  . insulin regular (NOVOLIN R RELION) 100 units/mL injection 20-30 U SQ q Supper  . Insulin Syringe-Needle U-100 (BD INSULIN SYRINGE ULTRAFINE) 31G X 5/16" 1 ML MISC Use as directed to administer your 70/30 insulin.  DX: 250.00  . losartan (COZAAR) 50 MG tablet TAKE 1 TABLET BY MOUTH DAILY.  . Multiple Vitamin (MULTIVITAMIN) tablet Take 1 tablet by mouth daily.    Marland Kitchen zolpidem (AMBIEN) 10 MG tablet Take 1 tablet (10 mg total) by mouth at bedtime as needed for sleep.  . [DISCONTINUED] insulin glargine (LANTUS) 100 UNIT/ML injection Inject 0.74 mLs (74 Units total) into the skin at bedtime. DX: E11.8  . [DISCONTINUED] insulin lispro (HUMALOG) 100 UNIT/ML injection INJECT 20-30 UNITS INTO THE SKIN 3 (THREE) TIMES DAILY BEFORE MEALS   No facility-administered encounter medications on file as of 10/27/2018.     Allergies  Allergen Reactions  . Lisinopril Cough  . Morphine     Other reaction(s): Hallucination    ROS Review of Systems  Constitutional: Negative for fatigue and fever.  HENT: Negative for congestion and sore throat.   Eyes: Negative for visual disturbance.  Respiratory: Negative for cough.   Cardiovascular: Negative for chest pain.  Gastrointestinal: Negative for abdominal pain and nausea.  Genitourinary: Negative for dysuria and frequency.  Musculoskeletal: Negative for back pain and joint swelling.  Skin: Negative for rash.  Neurological: Negative for weakness and headaches.  Hematological: Negative for adenopathy.  Psychiatric/Behavioral:  Negative for dysphoric mood.    PE; Blood pressure 125/74, pulse 73, temperature 97.6 F (36.4 C), temperature source Oral, resp. rate 16, weight 243 lb (110.2 kg), SpO2 96 %. Body mass index is 32.96 kg/m.  Gen: Alert, well appearing.  Patient is oriented to person, place, time, and situation. AFFECT: pleasant, lucid thought and speech. DJM:EQAS: no injection, icteris, swelling, or exudate.  EOMI, PERRLA. Mouth: lips without lesion/swelling.  Oral mucosa pink and moist. Oropharynx without erythema, exudate, or swelling.  Neck - No masses or thyromegaly  or limitation in range of motion CV: RRR, no m/r/g.   LUNGS: CTA bilat, nonlabored resps, good aeration in all lung fields. EXT: no clubbing or cyanosis.  No pitting edema.   Pertinent labs:  Lab Results  Component Value Date   TSH 0.63 08/24/2011   Lab Results  Component Value Date   WBC 4.2 (L) 10/31/2012   HGB 13.4 10/31/2012   HCT 39.5 10/31/2012   MCV 90.4 10/31/2012   PLT 151.0 10/31/2012   Lab Results  Component Value Date   CREATININE 2.53 (H) 09/16/2016   BUN 42 (H) 09/16/2016   NA 137 09/16/2016   K 4.6 09/16/2016   CL 104 09/16/2016   CO2 24 09/16/2016   Lab Results  Component Value Date   ALT 36 03/25/2016   AST 29 03/25/2016   ALKPHOS 61 03/25/2016   BILITOT 0.4 03/25/2016   Lab Results  Component Value Date   CHOL 156 03/25/2016   Lab Results  Component Value Date   HDL 31.20 (L) 03/25/2016   Lab Results  Component Value Date   LDLCALC 106 (H) 03/25/2016   Lab Results  Component Value Date   TRIG 94.0 03/25/2016   Lab Results  Component Value Date   CHOLHDL 5 03/25/2016   Lab Results  Component Value Date   HGBA1C 7.3 (H) 09/16/2016    ASSESSMENT AND PLAN:   Re-establishing pt.  1) DM 2: with proteinuria that is at least partially renal in origin. He has a neobladder ever since bladder resection for bladder cancer, so this is the source of some protein as well. His A1c's have  historically bounced around according to his diet/exercise and he is infamous for doing some odd self-management of his insulins/diabetic meds. Pt states most recent labs were about 6 mo ago, so I've ordered A1c and urine microalb/cr for him to get ASAP. For now, I just recommended he change the timing of his NPH insulin to BF and split his R up to give 1/2 the dose with BF and 1/2 with supper.  2) CRI III: combo of medical renal dz and chronic low grade obstruction of ureteral flow from bladder reconstruction surgery.  Ordered BMET-future/ASAP.  3) Obesity: he yo-yo's.  He says he is about to get back on diet and start exercising again. His goal wt is 200 lbs.  I encouraged small changes slowly.  4) Insomnia: I recommended he increase his ambien from 5mg  to 10mg  qhs prn.  Rx sent in today for 90 d supply with 1 RF.  An After Visit Summary was printed and given to the patient.  Return in about 3 months (around 01/26/2019) for routine chronic illness f/u.  Signed:  Crissie Sickles, MD           10/27/2018

## 2018-10-28 ENCOUNTER — Other Ambulatory Visit (INDEPENDENT_AMBULATORY_CARE_PROVIDER_SITE_OTHER): Payer: Medicare Other

## 2018-10-28 ENCOUNTER — Other Ambulatory Visit: Payer: Self-pay | Admitting: Family Medicine

## 2018-10-28 ENCOUNTER — Other Ambulatory Visit: Payer: Medicare Other

## 2018-10-28 DIAGNOSIS — E7800 Pure hypercholesterolemia, unspecified: Secondary | ICD-10-CM

## 2018-10-28 DIAGNOSIS — N184 Chronic kidney disease, stage 4 (severe): Secondary | ICD-10-CM

## 2018-10-28 DIAGNOSIS — D649 Anemia, unspecified: Secondary | ICD-10-CM

## 2018-10-28 DIAGNOSIS — Z794 Long term (current) use of insulin: Secondary | ICD-10-CM | POA: Diagnosis not present

## 2018-10-28 DIAGNOSIS — N183 Chronic kidney disease, stage 3 unspecified: Secondary | ICD-10-CM

## 2018-10-28 DIAGNOSIS — E118 Type 2 diabetes mellitus with unspecified complications: Secondary | ICD-10-CM

## 2018-10-28 DIAGNOSIS — D72821 Monocytosis (symptomatic): Secondary | ICD-10-CM

## 2018-10-28 LAB — MICROALBUMIN / CREATININE URINE RATIO
Creatinine,U: 42.7 mg/dL
Microalb Creat Ratio: 189.8 mg/g — ABNORMAL HIGH (ref 0.0–30.0)
Microalb, Ur: 81 mg/dL — ABNORMAL HIGH (ref 0.0–1.9)

## 2018-10-28 LAB — CBC WITH DIFFERENTIAL/PLATELET
BASOS PCT: 0.9 % (ref 0.0–3.0)
Basophils Absolute: 0 10*3/uL (ref 0.0–0.1)
Eosinophils Absolute: 0.1 10*3/uL (ref 0.0–0.7)
Eosinophils Relative: 2.6 % (ref 0.0–5.0)
HCT: 36.1 % — ABNORMAL LOW (ref 39.0–52.0)
Hemoglobin: 12.2 g/dL — ABNORMAL LOW (ref 13.0–17.0)
Lymphocytes Relative: 31.5 % (ref 12.0–46.0)
Lymphs Abs: 1.3 10*3/uL (ref 0.7–4.0)
MCHC: 33.9 g/dL (ref 30.0–36.0)
MCV: 87.7 fl (ref 78.0–100.0)
Monocytes Absolute: 1.1 10*3/uL — ABNORMAL HIGH (ref 0.1–1.0)
Monocytes Relative: 25.3 % — ABNORMAL HIGH (ref 3.0–12.0)
Neutro Abs: 1.7 10*3/uL (ref 1.4–7.7)
Neutrophils Relative %: 39.7 % — ABNORMAL LOW (ref 43.0–77.0)
Platelets: 137 10*3/uL — ABNORMAL LOW (ref 150.0–400.0)
RBC: 4.11 Mil/uL — ABNORMAL LOW (ref 4.22–5.81)
RDW: 14.2 % (ref 11.5–15.5)
WBC: 4.3 10*3/uL (ref 4.0–10.5)

## 2018-10-28 LAB — COMPREHENSIVE METABOLIC PANEL
ALT: 43 U/L (ref 0–53)
AST: 32 U/L (ref 0–37)
Albumin: 3.8 g/dL (ref 3.5–5.2)
Alkaline Phosphatase: 60 U/L (ref 39–117)
BUN: 45 mg/dL — ABNORMAL HIGH (ref 6–23)
CHLORIDE: 108 meq/L (ref 96–112)
CO2: 25 mEq/L (ref 19–32)
Calcium: 9.2 mg/dL (ref 8.4–10.5)
Creatinine, Ser: 3.11 mg/dL — ABNORMAL HIGH (ref 0.40–1.50)
GFR: 21.02 mL/min — ABNORMAL LOW (ref >60.00)
Glucose, Bld: 139 mg/dL — ABNORMAL HIGH (ref 70–99)
POTASSIUM: 4.9 meq/L (ref 3.5–5.1)
Sodium: 139 mEq/L (ref 135–145)
Total Bilirubin: 0.3 mg/dL (ref 0.2–1.2)
Total Protein: 7.3 g/dL (ref 6.0–8.3)

## 2018-10-28 LAB — LIPID PANEL
Cholesterol: 204 mg/dL — ABNORMAL HIGH (ref 0–200)
HDL: 34.4 mg/dL — AB (ref >39.00)
LDL Cholesterol: 144 mg/dL — ABNORMAL HIGH (ref 0–99)
NonHDL: 169.84
Total CHOL/HDL Ratio: 6
Triglycerides: 129 mg/dL (ref 0.0–149.0)
VLDL: 25.8 mg/dL (ref 0.0–40.0)

## 2018-10-28 LAB — HEMOGLOBIN A1C: Hgb A1c MFr Bld: 7.3 % — ABNORMAL HIGH (ref 4.6–6.5)

## 2018-10-30 ENCOUNTER — Other Ambulatory Visit: Payer: Self-pay | Admitting: Family Medicine

## 2018-10-30 DIAGNOSIS — D649 Anemia, unspecified: Secondary | ICD-10-CM

## 2018-10-31 ENCOUNTER — Encounter: Payer: Self-pay | Admitting: Family Medicine

## 2018-10-31 LAB — PATHOLOGIST SMEAR REVIEW

## 2018-11-07 ENCOUNTER — Other Ambulatory Visit (INDEPENDENT_AMBULATORY_CARE_PROVIDER_SITE_OTHER): Payer: Medicare Other

## 2018-11-07 DIAGNOSIS — N184 Chronic kidney disease, stage 4 (severe): Secondary | ICD-10-CM

## 2018-11-07 DIAGNOSIS — D649 Anemia, unspecified: Secondary | ICD-10-CM | POA: Diagnosis not present

## 2018-11-07 LAB — IRON: Iron: 95 ug/dL (ref 42–165)

## 2018-11-07 LAB — FERRITIN: FERRITIN: 153.5 ng/mL (ref 22.0–322.0)

## 2018-11-07 NOTE — Addendum Note (Signed)
Addended by: Ralph Dowdy on: 11/07/2018 09:42 AM   Modules accepted: Orders

## 2018-11-08 LAB — IRON, TOTAL/TOTAL IRON BINDING CAP
%SAT: 29 % (calc) (ref 20–48)
Iron: 91 ug/dL (ref 50–180)
TIBC: 317 mcg/dL (calc) (ref 250–425)

## 2018-11-14 ENCOUNTER — Other Ambulatory Visit: Payer: Medicare Other

## 2018-11-14 DIAGNOSIS — D649 Anemia, unspecified: Secondary | ICD-10-CM

## 2018-11-14 DIAGNOSIS — N184 Chronic kidney disease, stage 4 (severe): Secondary | ICD-10-CM

## 2018-11-14 LAB — HEMOCCULT SLIDES (X 3 CARDS)
Fecal Occult Blood: NEGATIVE
OCCULT 1: NEGATIVE
OCCULT 2: NEGATIVE
OCCULT 3: NEGATIVE
OCCULT 4: NEGATIVE
OCCULT 5: NEGATIVE

## 2018-11-16 ENCOUNTER — Ambulatory Visit: Payer: Medicare PPO | Admitting: Family Medicine

## 2018-11-17 ENCOUNTER — Encounter: Payer: Self-pay | Admitting: *Deleted

## 2019-01-23 ENCOUNTER — Telehealth: Payer: Self-pay

## 2019-01-23 NOTE — Telephone Encounter (Signed)
I need to know why patient is requesting an early RF.-thx

## 2019-01-23 NOTE — Telephone Encounter (Addendum)
Spoke with Donnetta Simpers at [facility] and patient still has refills on file until June. #120. Pharmacy is wanting to know if you approve refilling Ambien early.  Please advise, thanks.  Copied from [place] [id number]. Topic: General - Other >> Jan 23, 2019  2:33 PM Leward Quan A wrote: Reason for CRM: Stark called [facility] that patient would like to refill his zolpidem (AMBIEN) 10 MG tablet 11 days early. Say that this medication was last refilled on 01/04/2019 for 30 days. Requesting a call back with permission to refill early. [facility] [phone] (Phone)

## 2019-01-24 NOTE — Telephone Encounter (Signed)
Spoke with patient and 2 weeks ago, he convinced his wife(Jane) to take one of his Ambien and she has been able to sleep better. He was calling to see if she could get a Rx also. He wanted to know if there was something he can get for his heel spur on L foot when we start seeing patients in office again.  Please advise, thanks.

## 2019-01-24 NOTE — Telephone Encounter (Signed)
I called pt and said I could not authorize early RF on controlled substance. He expressed understanding.

## 2019-01-27 ENCOUNTER — Ambulatory Visit: Payer: Medicare Other | Admitting: Family Medicine

## 2019-02-02 ENCOUNTER — Ambulatory Visit: Payer: Medicare Other | Admitting: Family Medicine

## 2019-02-09 ENCOUNTER — Other Ambulatory Visit: Payer: Self-pay

## 2019-02-09 ENCOUNTER — Ambulatory Visit (INDEPENDENT_AMBULATORY_CARE_PROVIDER_SITE_OTHER): Payer: Medicare Other

## 2019-02-09 ENCOUNTER — Other Ambulatory Visit: Payer: Self-pay | Admitting: Podiatry

## 2019-02-09 ENCOUNTER — Ambulatory Visit (INDEPENDENT_AMBULATORY_CARE_PROVIDER_SITE_OTHER): Payer: Medicare Other | Admitting: Podiatry

## 2019-02-09 ENCOUNTER — Encounter: Payer: Self-pay | Admitting: Podiatry

## 2019-02-09 VITALS — BP 122/77 | HR 98 | Temp 97.7°F | Resp 16

## 2019-02-09 DIAGNOSIS — E1140 Type 2 diabetes mellitus with diabetic neuropathy, unspecified: Secondary | ICD-10-CM | POA: Diagnosis not present

## 2019-02-09 DIAGNOSIS — M722 Plantar fascial fibromatosis: Secondary | ICD-10-CM | POA: Diagnosis not present

## 2019-02-09 DIAGNOSIS — E1149 Type 2 diabetes mellitus with other diabetic neurological complication: Secondary | ICD-10-CM | POA: Diagnosis not present

## 2019-02-09 DIAGNOSIS — M79672 Pain in left foot: Secondary | ICD-10-CM

## 2019-02-09 MED ORDER — TRIAMCINOLONE ACETONIDE 10 MG/ML IJ SUSP
10.0000 mg | Freq: Once | INTRAMUSCULAR | Status: AC
  Administered 2019-02-09: 12:00:00 10 mg

## 2019-02-09 NOTE — Progress Notes (Signed)
   Subjective:    Patient ID: Alexander [name], male    DOB: 1946-03-18, 73 y.o.   MRN: [id number]  HPI    Review of Systems  All other systems reviewed and are negative.      Objective:   Physical Exam        Assessment & Plan:

## 2019-02-09 NOTE — Patient Instructions (Signed)

## 2019-02-13 NOTE — Progress Notes (Signed)
Subjective:   Patient ID: Alexander [name], male   DOB: 73 y.o.   MRN: [id number]   HPI Patient presents stating he has worn diabetic shoes for a long time and needs new pair and he has exquisite discomfort plantar aspect left heel which is become increasingly sore.  His sugars under reasonably good control and has had for a long time and he does have neuropathic changes and patient does not smoke likes to be active   Review of Systems  All other systems reviewed and are negative.       Objective:  Physical Exam Vitals signs and nursing note reviewed.  Constitutional:      Appearance: He is well-developed.  Pulmonary:     Effort: Pulmonary effort is normal.  Musculoskeletal: Normal range of motion.  Skin:    General: Skin is warm.  Neurological:     Mental Status: He is alert.     Neurovascular status intact muscle strength is adequate range of motion within normal limits with patient noted to have exquisite discomfort plantar aspect left heel at the insertional point tendon calcaneus with inflammation fluid buildup and is noted to have structural deformity along with diminishment of sharp dull vibratory     Assessment:  Long-term diabetic with at risk complications associated with neuropathic changes with plantar fascial inflammation and digital deformity     Plan:  H&P reviewed condition and today I did sterile prep and injected the plantar fascial left 3 mg Kenalog 5 mg Xylocaine and applied fascial brace.  I instructed on supportive shoes and I went ahead and I discussed digital deformities and diabetic shoes with wide toe box.  Patient wants this made and is going to be scheduled for this to be done with approval to be from [facility]  X-ray indicates that there is spur formation with no indication to stress fracture or advanced arthritis or Charcot foot structure

## 2019-02-16 ENCOUNTER — Encounter: Payer: Self-pay | Admitting: Family Medicine

## 2019-02-23 ENCOUNTER — Other Ambulatory Visit: Payer: Self-pay

## 2019-02-23 ENCOUNTER — Telehealth: Payer: Self-pay | Admitting: Family Medicine

## 2019-02-23 ENCOUNTER — Ambulatory Visit (INDEPENDENT_AMBULATORY_CARE_PROVIDER_SITE_OTHER): Payer: Medicare Other | Admitting: Podiatry

## 2019-02-23 ENCOUNTER — Encounter: Payer: Self-pay | Admitting: Podiatry

## 2019-02-23 VITALS — Temp 97.3°F

## 2019-02-23 DIAGNOSIS — M722 Plantar fascial fibromatosis: Secondary | ICD-10-CM | POA: Diagnosis not present

## 2019-02-23 MED ORDER — TRIAMCINOLONE ACETONIDE 10 MG/ML IJ SUSP
10.0000 mg | Freq: Once | INTRAMUSCULAR | Status: AC
  Administered 2019-02-23: 10 mg

## 2019-02-23 NOTE — Telephone Encounter (Signed)
Copied from [place] [id number]. Topic: Quick Communication - See Telephone Encounter >> Feb 23, 2019  3:52 PM Vernona Rieger wrote: CRM for notification. See Telephone encounter for: 02/23/19.  Patient said Dr Mellody Drown office has faxed three different times for Dr Anitra Lauth to fill out and send back so he is able to get diabetic shoes. Please advise.

## 2019-02-24 NOTE — Telephone Encounter (Signed)
Faxed 02/24/19 to [facility]

## 2019-02-24 NOTE — Telephone Encounter (Signed)
Only received PW regarding diabetic shoes just yesterday, will give to PCP to review and sign.

## 2019-02-24 NOTE — Progress Notes (Signed)
Subjective:   Patient ID: Alexander [name], male   DOB: 73 y.o.   MRN: [id number]   HPI Patient states overall doing pretty well but states that the left heel is just moderately tender when he is active   ROS      Objective:  Physical Exam  Neurovascular status intact with diminishment of discomfort plantar aspect left heel with standing still difficult with pain to palpation to the tendon at its insertion calcaneus     Assessment:  Acute plantar fasciitis left with inflammation fluid buildup     Plan:  H&P in today I did sterile prep and injected the fascia 3 mg Kenalog 5 mg Xylocaine advised on physical therapy anti-inflammatories and reappoint to recheck

## 2019-02-24 NOTE — Telephone Encounter (Signed)
Signed and put in box to go up front.  

## 2019-02-27 ENCOUNTER — Encounter: Payer: Self-pay | Admitting: Family Medicine

## 2019-03-06 ENCOUNTER — Telehealth: Payer: Self-pay | Admitting: Family Medicine

## 2019-03-06 ENCOUNTER — Other Ambulatory Visit: Payer: Self-pay

## 2019-03-06 MED ORDER — GLUCOSE BLOOD VI STRP: ORAL_STRIP | 3 refills | Status: DC

## 2019-03-06 NOTE — Telephone Encounter (Signed)
Needs refill on his test strips. Please send over today, before lunch patient request to [facility] [address], [place]  glucose blood (ACCU-CHEK SMARTVIEW) test strip   Thank you Hinton Dyer

## 2019-03-07 NOTE — Telephone Encounter (Signed)
MyChart message read.

## 2019-03-20 ENCOUNTER — Ambulatory Visit (INDEPENDENT_AMBULATORY_CARE_PROVIDER_SITE_OTHER): Payer: Medicare Other | Admitting: Orthotics

## 2019-03-20 ENCOUNTER — Other Ambulatory Visit: Payer: Self-pay

## 2019-03-20 DIAGNOSIS — M2040 Other hammer toe(s) (acquired), unspecified foot: Secondary | ICD-10-CM

## 2019-03-20 DIAGNOSIS — E1149 Type 2 diabetes mellitus with other diabetic neurological complication: Secondary | ICD-10-CM

## 2019-03-20 DIAGNOSIS — E1140 Type 2 diabetes mellitus with diabetic neuropathy, unspecified: Secondary | ICD-10-CM | POA: Diagnosis not present

## 2019-03-20 DIAGNOSIS — M722 Plantar fascial fibromatosis: Secondary | ICD-10-CM | POA: Diagnosis not present

## 2019-03-20 DIAGNOSIS — M2041 Other hammer toe(s) (acquired), right foot: Secondary | ICD-10-CM

## 2019-03-21 NOTE — Progress Notes (Signed)

## 2019-04-20 ENCOUNTER — Telehealth: Payer: Self-pay | Admitting: Family Medicine

## 2019-04-20 NOTE — Telephone Encounter (Signed)
Copied from [place] [phone]. Topic: General - Inquiry >> Apr 20, 2019  4:12 PM Mathis Bud wrote: Reason for CRM: Patient would like a 90 day prescription of zolpidem (AMBIEN) 10 MG, due to insurance purposes, it would be cheaper for patient.  losartan (COZAAR) 50 MG ACCU-CHEK FASTCLIX LANCETS MISC glucose blood (ACCU-CHEK SMARTVIEW) test strip   New pharmacy:  [facility] [place], [place] [address]  [phone]

## 2019-04-21 MED ORDER — ACCU-CHEK SMARTVIEW VI STRP: ORAL_STRIP | 3 refills | Status: DC

## 2019-04-21 MED ORDER — ACCU-CHEK FASTCLIX LANCETS MISC: 3 refills | Status: DC

## 2019-04-21 MED ORDER — ZOLPIDEM TARTRATE 10 MG PO TABS: 10.0000 mg | ORAL_TABLET | Freq: Every evening | ORAL | 0 refills | Status: DC | PRN

## 2019-04-21 MED ORDER — LOSARTAN POTASSIUM 50 MG PO TABS: ORAL_TABLET | ORAL | 0 refills | Status: DC

## 2019-04-21 NOTE — Telephone Encounter (Signed)
RF request for Zolpidem LOV: 10/27/18 Next ov:  Advised to f/u 3 mo Last written: 10/27/18 (30,5) end date 11/26/18 No CSC or UDS PMP aware printed   RF request for Losartan LOV: 10/27/18 Next ov: 3 mo f/u Last written: 09/16/16 (90,3)  RF request for Accu-chek lancets LOV: 10/27/18 Next ov: f/u 3 mo Last written: 09/23/16 (204,3)  RF request for Accu-chek strips LOV: 10/27/18 Next ov: f/u 3 mo Last written: 03/06/19 (200,3)  Pt is now using [facility] for Rx. Please advise, thanks.

## 2019-04-21 NOTE — Telephone Encounter (Signed)
Pls take care of the lancets and strips stuff. I'll do his losartan and zolpidem for 90d supply with no RF. Needs office f/u before running out of these.-thx

## 2019-04-24 NOTE — Telephone Encounter (Signed)
MyChart message read.

## 2019-07-26 ENCOUNTER — Ambulatory Visit: Payer: Medicare Other | Admitting: Family Medicine

## 2019-07-27 ENCOUNTER — Other Ambulatory Visit: Payer: Self-pay

## 2019-07-27 ENCOUNTER — Ambulatory Visit (INDEPENDENT_AMBULATORY_CARE_PROVIDER_SITE_OTHER): Payer: Medicare Other | Admitting: Family Medicine

## 2019-07-27 ENCOUNTER — Encounter: Payer: Self-pay | Admitting: Family Medicine

## 2019-07-27 VITALS — BP 104/63 | HR 64 | Temp 97.7°F | Resp 16 | Ht 73.0 in | Wt 219.5 lb

## 2019-07-27 DIAGNOSIS — Z794 Long term (current) use of insulin: Secondary | ICD-10-CM | POA: Diagnosis not present

## 2019-07-27 DIAGNOSIS — N184 Chronic kidney disease, stage 4 (severe): Secondary | ICD-10-CM | POA: Diagnosis not present

## 2019-07-27 DIAGNOSIS — E1129 Type 2 diabetes mellitus with other diabetic kidney complication: Secondary | ICD-10-CM

## 2019-07-27 DIAGNOSIS — M7080 Other soft tissue disorders related to use, overuse and pressure of unspecified site: Secondary | ICD-10-CM | POA: Diagnosis not present

## 2019-07-27 DIAGNOSIS — R809 Proteinuria, unspecified: Secondary | ICD-10-CM | POA: Diagnosis not present

## 2019-07-27 DIAGNOSIS — Z0000 Encounter for general adult medical examination without abnormal findings: Secondary | ICD-10-CM

## 2019-07-27 DIAGNOSIS — G4700 Insomnia, unspecified: Secondary | ICD-10-CM

## 2019-07-27 DIAGNOSIS — X503XXA Overexertion from repetitive movements, initial encounter: Secondary | ICD-10-CM

## 2019-07-27 LAB — BASIC METABOLIC PANEL
BUN: 47 mg/dL — ABNORMAL HIGH (ref 6–23)
CO2: 24 mEq/L (ref 19–32)
Calcium: 8.9 mg/dL (ref 8.4–10.5)
Chloride: 108 mEq/L (ref 96–112)
Creatinine, Ser: 3.19 mg/dL — ABNORMAL HIGH (ref 0.40–1.50)
GFR: 19.17 mL/min — ABNORMAL LOW (ref >60.00)
Glucose, Bld: 89 mg/dL (ref 70–99)
Potassium: 5.1 mEq/L (ref 3.5–5.1)
Sodium: 139 mEq/L (ref 135–145)

## 2019-07-27 LAB — HEMOGLOBIN A1C: Hgb A1c MFr Bld: 6.5 % (ref 4.6–6.5)

## 2019-07-27 MED ORDER — LOSARTAN POTASSIUM 50 MG PO TABS: ORAL_TABLET | ORAL | 3 refills | Status: DC

## 2019-07-27 MED ORDER — ZOLPIDEM TARTRATE 10 MG PO TABS: 10.0000 mg | ORAL_TABLET | Freq: Every evening | ORAL | 1 refills | Status: DC | PRN

## 2019-07-27 MED ORDER — CYCLOBENZAPRINE HCL 10 MG PO TABS: 10.0000 mg | ORAL_TABLET | Freq: Three times a day (TID) | ORAL | 0 refills | Status: DC | PRN

## 2019-07-27 NOTE — Progress Notes (Signed)
OFFICE VISIT  07/27/2019   CC:  Chief Complaint  Patient presents with  . Follow-up    RCI. need medication refills.     HPI:    Patient is a 73 y.o. 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 I last saw him 9 mo ago.   Last visit I recommended he change the timing of his NPH insulin to morning and take 1/2 his regular insulin at BF and 1/2 regular insulin dose with supper. His last A1c was 7.4%.  He has CRI with GFR around 30 ml/min->refuses to see nephrologist (see PMH section below for details).  Interim hx: Home gluc monitoring lately has been 90-110.  One was 94 but he had no hypoglyc sx's. He has decreased his NPH to 30-40 U usually, sometimes up to 70 U. Doing wt watchers.  He has overdone it physically with yard work on occasion, has had some overuse muscle pain for a day or two after. Requests muscle relaxer to use prn infrequently when this happens. He has never been on one before.  Uses ambien nightly and has great sleep now. NO adverse side effects.  Feet: denies burning, tingling, or numbness in feet.  ROS: no CP, no SOB, no wheezing, no cough, no dizziness, no HAs, no rashes, no melena/hematochezia.  No polyuria or polydipsia.  No myalgias or arthralgias.   Past Medical History:  Diagnosis Date  . Bladder cancer ([place]) 1996   Remission  . Chronic renal insufficiency, stage III (moderate) (HCC)    Borderline stage 4 (CrCl @30 ).  Obstructive uropathy + medicorenal dz:  R>L hydroureteronephrosis 2009; no mass , just chronic poor emptying of neobladder) + diabetic nephropathy.  As of 10/2018, as renal function declined again, he refused to see a nephrologist.  . Diabetes mellitus with complication ([place])    EPP2R 6.5% on 02/27/10.  Hx of 384 mg on 24h urine.  DPN 2020.  . Diabetic peripheral neuropathy ([place])   . ED (erectile dysfunction)   . Elevated transaminase level 2013   Abd u/s nl; viral hep  serologies neg  . Hyperlipidemia    Hypertriglyceridemia (on and off meds over the years).  Statin intolerant  . Hypertension   . Insomnia   . Plantar fasciitis    Steroid injection by Dr. Paulla Dolly 02/2019  . 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 CARDIOVASCULAR STRESS TEST  04/2013   Normal myocardial perfusion stress test.  . CYSTECTOMY  1997   with continent urinary reservoir (ileal loop) and artificial urinary sphincter placement-[facility]   . URINARY SPHINCTER REVISION     [facility] 2018    Outpatient Medications Prior to Visit  Medication Sig Dispense Refill  . Accu-Chek FastClix Lancets MISC Use to check blood sugar 2 times daily 204 each 3  . aspirin 81 MG tablet Take 81 mg by mouth daily.      . B Complex Vitamins (VITAMIN B COMPLEX PO) once daily.    Marland Kitchen glucose blood (ACCU-CHEK SMARTVIEW) test strip Use to check blood sugar 2 times daily 200 each 3  . insulin NPH Human (NOVOLIN N RELION) 100 UNIT/ML injection 70 U SQ q supper 10 mL 5  . Insulin Pen Needle 32G X 6 MM MISC Use for your pen insulin injection 4 times per day 100 each 10  . insulin regular (NOVOLIN R RELION) 100 units/mL injection 20-30 U SQ q Supper  10 mL 6  . Insulin Syringe-Needle U-100 (BD INSULIN SYRINGE ULTRAFINE) 31G X 5/16" 1 ML MISC Use as directed to administer your 70/30 insulin.  DX: 250.00 100 each prn  . Multiple Vitamin (MULTIVITAMIN) tablet Take 1 tablet by mouth daily.      Marland Kitchen losartan (COZAAR) 50 MG tablet TAKE 1 TABLET BY MOUTH DAILY. 90 tablet 0  . zolpidem (AMBIEN) 10 MG tablet Take 1 tablet (10 mg total) by mouth at bedtime as needed for sleep. 90 tablet 0   No facility-administered medications prior to visit.     Allergies  Allergen Reactions  . Atorvastatin Other (See Comments)  . Lisinopril Cough  . Morphine     Other reaction(s): Hallucination    ROS As per HPI  PE: Blood pressure 104/63, pulse 64, temperature 97.7 F (36.5 C), temperature  source Temporal, resp. rate 16, height 6\' 1"  (1.854 m), weight 219 lb 8 oz (99.6 kg), SpO2 99 %. Body mass index is 28.96 kg/m. Body mass index is 28.96 kg/m.   Gen: Alert, well appearing.  Patient is oriented to person, place, time, and situation. AFFECT: pleasant, lucid thought and speech. EXT: no clubbing or cyanosis.  no edema.  Foot exam - no swelling, tenderness or skin or vascular lesions. Color and temperature is normal. Sensation is intact. Peripheral pulses are palpable. Toenails are thick and somewhat long.   LABS:  Lab Results  Component Value Date   TSH 0.63 08/24/2011   Lab Results  Component Value Date   WBC 4.3 10/28/2018   HGB 12.2 (L) 10/28/2018   HCT 36.1 (L) 10/28/2018   MCV 87.7 10/28/2018   PLT 137.0 (L) 10/28/2018   Lab Results  Component Value Date   CREATININE 3.11 (H) 10/28/2018   BUN 45 (H) 10/28/2018   NA 139 10/28/2018   K 4.9 10/28/2018   CL 108 10/28/2018   CO2 25 10/28/2018   Lab Results  Component Value Date   ALT 43 10/28/2018   AST 32 10/28/2018   ALKPHOS 60 10/28/2018   BILITOT 0.3 10/28/2018   Lab Results  Component Value Date   CHOL 204 (H) 10/28/2018   Lab Results  Component Value Date   HDL 34.40 (L) 10/28/2018   Lab Results  Component Value Date   LDLCALC 144 (H) 10/28/2018   Lab Results  Component Value Date   TRIG 129.0 10/28/2018   Lab Results  Component Value Date   CHOLHDL 6 10/28/2018   Lab Results  Component Value Date   HGBA1C 7.3 (H) 10/28/2018   IMPRESSION AND PLAN:  1) DM 2. Feet exam normal. HbA1c and BMET today. No changes in insulin at this time---he tends to ignore my recommendations anyway. Feet, BMET, A1c.  2) Insomnia: doing well on ambien nightly. RF'd med today.  3) CRI with GFR around 30 ml/min:  combo of medical renal dz and chronic low grade obstruction of ureteral flow from bladder reconstruction surgery.  Ordered BMET today.  He has repeatedly declined any nephrology  evaluation.  4) Preventative health care: Flu vaccine-->pt declines.  5) Overuse muscle pain: I did rx for #30 flexeril 10mg  tabs to use 1 tid prn, no RF. Therapeutic expectations and side effect profile of medication discussed today.  Patient's questions answered.  An After Visit Summary was printed and given to the patient.  FOLLOW UP: Return in about 6 months (around 01/24/2020) for routine chronic illness f/u.  Signed:  Crissie Sickles, MD  07/27/2019     

## 2019-08-03 DIAGNOSIS — D649 Anemia, unspecified: Secondary | ICD-10-CM

## 2019-08-03 DIAGNOSIS — U071 COVID-19: Secondary | ICD-10-CM

## 2019-08-03 DIAGNOSIS — N179 Acute kidney failure, unspecified: Secondary | ICD-10-CM

## 2019-08-03 HISTORY — DX: COVID-19: U07.1

## 2019-08-03 HISTORY — DX: Anemia, unspecified: D64.9

## 2019-08-03 HISTORY — DX: Acute kidney failure, unspecified: N17.9

## 2019-08-09 ENCOUNTER — Telehealth: Payer: Self-pay | Admitting: Family Medicine

## 2019-08-09 DIAGNOSIS — Z66 Do not resuscitate: Secondary | ICD-10-CM | POA: Diagnosis not present

## 2019-08-09 DIAGNOSIS — R319 Hematuria, unspecified: Secondary | ICD-10-CM | POA: Diagnosis not present

## 2019-08-09 DIAGNOSIS — E119 Type 2 diabetes mellitus without complications: Secondary | ICD-10-CM | POA: Diagnosis not present

## 2019-08-09 DIAGNOSIS — Z5320 Procedure and treatment not carried out because of patient's decision for unspecified reasons: Secondary | ICD-10-CM | POA: Diagnosis not present

## 2019-08-09 DIAGNOSIS — N19 Unspecified kidney failure: Secondary | ICD-10-CM | POA: Diagnosis not present

## 2019-08-09 DIAGNOSIS — R000 Tachycardia, unspecified: Secondary | ICD-10-CM | POA: Diagnosis not present

## 2019-08-09 DIAGNOSIS — N390 Urinary tract infection, site not specified: Secondary | ICD-10-CM | POA: Diagnosis not present

## 2019-08-09 DIAGNOSIS — R9431 Abnormal electrocardiogram [ECG] [EKG]: Secondary | ICD-10-CM | POA: Diagnosis not present

## 2019-08-09 DIAGNOSIS — N1339 Other hydronephrosis: Secondary | ICD-10-CM | POA: Diagnosis not present

## 2019-08-09 DIAGNOSIS — N179 Acute kidney failure, unspecified: Secondary | ICD-10-CM | POA: Diagnosis not present

## 2019-08-09 DIAGNOSIS — E875 Hyperkalemia: Secondary | ICD-10-CM | POA: Diagnosis not present

## 2019-08-09 DIAGNOSIS — E872 Acidosis: Secondary | ICD-10-CM | POA: Diagnosis not present

## 2019-08-09 DIAGNOSIS — R339 Retention of urine, unspecified: Secondary | ICD-10-CM | POA: Diagnosis not present

## 2019-08-09 DIAGNOSIS — R945 Abnormal results of liver function studies: Secondary | ICD-10-CM | POA: Diagnosis not present

## 2019-08-09 DIAGNOSIS — N184 Chronic kidney disease, stage 4 (severe): Secondary | ICD-10-CM | POA: Diagnosis not present

## 2019-08-09 DIAGNOSIS — U071 COVID-19: Secondary | ICD-10-CM | POA: Diagnosis not present

## 2019-08-09 DIAGNOSIS — R079 Chest pain, unspecified: Secondary | ICD-10-CM | POA: Diagnosis not present

## 2019-08-09 DIAGNOSIS — C679 Malignant neoplasm of bladder, unspecified: Secondary | ICD-10-CM | POA: Diagnosis not present

## 2019-08-09 DIAGNOSIS — N183 Chronic kidney disease, stage 3 unspecified: Secondary | ICD-10-CM | POA: Diagnosis not present

## 2019-08-09 DIAGNOSIS — N1330 Unspecified hydronephrosis: Secondary | ICD-10-CM | POA: Diagnosis not present

## 2019-08-09 DIAGNOSIS — Z960 Presence of urogenital implants: Secondary | ICD-10-CM | POA: Diagnosis not present

## 2019-08-09 DIAGNOSIS — N189 Chronic kidney disease, unspecified: Secondary | ICD-10-CM | POA: Diagnosis not present

## 2019-08-09 NOTE — Telephone Encounter (Signed)
Patient's daughter called back. She is at their house. She was very concerned. Her father has lost 12 pounds. Advised patient's daughter to take the patient to the ER.

## 2019-08-09 NOTE — Telephone Encounter (Signed)
Patient is SOB, very weak. Patient started having chills 08/05/19. Patient has also been having symptoms of UTI, urine has been grey but has starting clearing up for the past 2 days. Patient feels too weak to go any where but wants to know if something can be called in to the pharmacy. He will get his daughter to pick it up. His wife is also sick. They both have been sleeping a lot. I will note her chart as well. Patient is requesting a call back at [phone].

## 2019-08-10 DIAGNOSIS — R000 Tachycardia, unspecified: Secondary | ICD-10-CM | POA: Diagnosis not present

## 2019-08-10 DIAGNOSIS — R9431 Abnormal electrocardiogram [ECG] [EKG]: Secondary | ICD-10-CM | POA: Diagnosis not present

## 2019-08-11 DIAGNOSIS — N183 Chronic kidney disease, stage 3 unspecified: Secondary | ICD-10-CM | POA: Diagnosis not present

## 2019-08-11 DIAGNOSIS — Z7982 Long term (current) use of aspirin: Secondary | ICD-10-CM | POA: Diagnosis not present

## 2019-08-11 DIAGNOSIS — Z66 Do not resuscitate: Secondary | ICD-10-CM | POA: Diagnosis present

## 2019-08-11 DIAGNOSIS — Z8551 Personal history of malignant neoplasm of bladder: Secondary | ICD-10-CM | POA: Diagnosis not present

## 2019-08-11 DIAGNOSIS — R945 Abnormal results of liver function studies: Secondary | ICD-10-CM | POA: Diagnosis not present

## 2019-08-11 DIAGNOSIS — N19 Unspecified kidney failure: Secondary | ICD-10-CM | POA: Diagnosis not present

## 2019-08-11 DIAGNOSIS — Z936 Other artificial openings of urinary tract status: Secondary | ICD-10-CM | POA: Diagnosis not present

## 2019-08-11 DIAGNOSIS — R000 Tachycardia, unspecified: Secondary | ICD-10-CM | POA: Diagnosis not present

## 2019-08-11 DIAGNOSIS — U071 COVID-19: Secondary | ICD-10-CM | POA: Diagnosis not present

## 2019-08-11 DIAGNOSIS — R0682 Tachypnea, not elsewhere classified: Secondary | ICD-10-CM | POA: Diagnosis not present

## 2019-08-11 DIAGNOSIS — N136 Pyonephrosis: Secondary | ICD-10-CM | POA: Diagnosis not present

## 2019-08-11 DIAGNOSIS — E875 Hyperkalemia: Secondary | ICD-10-CM | POA: Diagnosis not present

## 2019-08-11 DIAGNOSIS — C679 Malignant neoplasm of bladder, unspecified: Secondary | ICD-10-CM | POA: Diagnosis not present

## 2019-08-11 DIAGNOSIS — N189 Chronic kidney disease, unspecified: Secondary | ICD-10-CM | POA: Diagnosis not present

## 2019-08-11 DIAGNOSIS — N1330 Unspecified hydronephrosis: Secondary | ICD-10-CM | POA: Diagnosis present

## 2019-08-11 DIAGNOSIS — R809 Proteinuria, unspecified: Secondary | ICD-10-CM | POA: Diagnosis present

## 2019-08-11 DIAGNOSIS — E785 Hyperlipidemia, unspecified: Secondary | ICD-10-CM | POA: Diagnosis present

## 2019-08-11 DIAGNOSIS — N179 Acute kidney failure, unspecified: Secondary | ICD-10-CM | POA: Diagnosis present

## 2019-08-11 DIAGNOSIS — D649 Anemia, unspecified: Secondary | ICD-10-CM | POA: Diagnosis not present

## 2019-08-11 DIAGNOSIS — I129 Hypertensive chronic kidney disease with stage 1 through stage 4 chronic kidney disease, or unspecified chronic kidney disease: Secondary | ICD-10-CM | POA: Diagnosis present

## 2019-08-11 DIAGNOSIS — N390 Urinary tract infection, site not specified: Secondary | ICD-10-CM | POA: Diagnosis not present

## 2019-08-11 DIAGNOSIS — R338 Other retention of urine: Secondary | ICD-10-CM | POA: Diagnosis not present

## 2019-08-11 DIAGNOSIS — R339 Retention of urine, unspecified: Secondary | ICD-10-CM | POA: Diagnosis not present

## 2019-08-11 DIAGNOSIS — N1339 Other hydronephrosis: Secondary | ICD-10-CM | POA: Diagnosis not present

## 2019-08-11 DIAGNOSIS — Z794 Long term (current) use of insulin: Secondary | ICD-10-CM | POA: Diagnosis not present

## 2019-08-11 DIAGNOSIS — Z960 Presence of urogenital implants: Secondary | ICD-10-CM | POA: Diagnosis not present

## 2019-08-11 DIAGNOSIS — Z906 Acquired absence of other parts of urinary tract: Secondary | ICD-10-CM | POA: Diagnosis not present

## 2019-08-11 DIAGNOSIS — E872 Acidosis: Secondary | ICD-10-CM | POA: Diagnosis present

## 2019-08-11 DIAGNOSIS — N184 Chronic kidney disease, stage 4 (severe): Secondary | ICD-10-CM | POA: Diagnosis present

## 2019-08-11 DIAGNOSIS — D72810 Lymphocytopenia: Secondary | ICD-10-CM | POA: Diagnosis present

## 2019-08-11 DIAGNOSIS — E1122 Type 2 diabetes mellitus with diabetic chronic kidney disease: Secondary | ICD-10-CM | POA: Diagnosis present

## 2019-08-11 DIAGNOSIS — E119 Type 2 diabetes mellitus without complications: Secondary | ICD-10-CM | POA: Diagnosis not present

## 2019-08-16 DIAGNOSIS — R339 Retention of urine, unspecified: Secondary | ICD-10-CM | POA: Insufficient documentation

## 2019-08-16 DIAGNOSIS — U071 COVID-19: Secondary | ICD-10-CM | POA: Insufficient documentation

## 2019-08-16 DIAGNOSIS — N19 Unspecified kidney failure: Secondary | ICD-10-CM | POA: Insufficient documentation

## 2019-08-16 LAB — BASIC METABOLIC PANEL
BUN: 40 — AB (ref 4–21)
Creatinine: 2.3 — AB (ref 0.6–1.3)
Potassium: 4 (ref 3.4–5.3)
Sodium: 138 (ref 137–147)
Sodium: 4 — AB (ref 137–147)

## 2019-08-16 LAB — CBC AND DIFFERENTIAL
HCT: 30 — AB (ref 41–53)
Hemoglobin: 9.9 — AB (ref 13.5–17.5)
Platelets: 149 — AB (ref 150–399)
WBC: 4.8

## 2019-08-16 LAB — HEPATIC FUNCTION PANEL
ALT: 73 — AB (ref 10–40)
AST: 58 — AB (ref 14–40)

## 2019-08-16 MED ORDER — HEPARIN SODIUM (PORCINE) 5000 UNIT/ML IJ SOLN
5000.00 | INTRAMUSCULAR | Status: DC
Start: 2019-08-16 — End: 2019-08-16

## 2019-08-16 MED ORDER — ALBUTEROL SULFATE HFA 108 (90 BASE) MCG/ACT IN AERS
2.00 | INHALATION_SPRAY | RESPIRATORY_TRACT | Status: DC
Start: ? — End: 2019-08-16

## 2019-08-16 MED ORDER — SODIUM CHLORIDE 0.9 % IV SOLN
INTRAVENOUS | Status: DC
Start: ? — End: 2019-08-16

## 2019-08-16 MED ORDER — ONDANSETRON HCL 4 MG/2ML IJ SOLN
4.00 | INTRAMUSCULAR | Status: DC
Start: ? — End: 2019-08-16

## 2019-08-16 MED ORDER — GLUCOSE 40 % PO GEL
15.00 | ORAL | Status: DC
Start: ? — End: 2019-08-16

## 2019-08-16 MED ORDER — INSULIN GLARGINE 100 UNIT/ML ~~LOC~~ SOLN
15.00 | SUBCUTANEOUS | Status: DC
Start: 2019-08-16 — End: 2019-08-16

## 2019-08-16 MED ORDER — INSULIN LISPRO 100 UNIT/ML ~~LOC~~ SOLN
2.00 | SUBCUTANEOUS | Status: DC
Start: 2019-08-16 — End: 2019-08-16

## 2019-08-16 MED ORDER — DEXTROSE 10 % IV SOLN
125.00 | INTRAVENOUS | Status: DC
Start: ? — End: 2019-08-16

## 2019-08-16 MED ORDER — ACETAMINOPHEN 325 MG PO TABS
650.00 | ORAL_TABLET | ORAL | Status: DC
Start: ? — End: 2019-08-16

## 2019-08-16 MED ORDER — GLUCAGON HCL RDNA (DIAGNOSTIC) 1 MG IJ SOLR
1.00 | INTRAMUSCULAR | Status: DC
Start: ? — End: 2019-08-16

## 2019-08-28 DIAGNOSIS — R339 Retention of urine, unspecified: Secondary | ICD-10-CM | POA: Diagnosis not present

## 2019-08-28 DIAGNOSIS — N183 Chronic kidney disease, stage 3 unspecified: Secondary | ICD-10-CM | POA: Diagnosis not present

## 2019-08-28 DIAGNOSIS — N1330 Unspecified hydronephrosis: Secondary | ICD-10-CM | POA: Diagnosis not present

## 2019-08-29 ENCOUNTER — Telehealth: Payer: Self-pay

## 2019-08-29 NOTE — Telephone Encounter (Signed)
I've looked in that stuff but I'll look again.-thx

## 2019-08-29 NOTE — Telephone Encounter (Signed)
i'll ask brit tomorrow when she comes in

## 2019-08-29 NOTE — Telephone Encounter (Signed)
Paperwork was faxed over last week according to [facility]. Paperwork may be mixed in with stack on file holder in office. Please let me know if it is not in that stack. I will request records again.

## 2019-08-30 ENCOUNTER — Encounter: Payer: Self-pay | Admitting: Family Medicine

## 2019-08-30 NOTE — Telephone Encounter (Signed)
Paperwork found and abstracted by [employer].

## 2019-09-01 DIAGNOSIS — R339 Retention of urine, unspecified: Secondary | ICD-10-CM | POA: Diagnosis not present

## 2019-09-01 DIAGNOSIS — N183 Chronic kidney disease, stage 3 unspecified: Secondary | ICD-10-CM | POA: Diagnosis not present

## 2019-09-01 DIAGNOSIS — N1330 Unspecified hydronephrosis: Secondary | ICD-10-CM | POA: Diagnosis not present

## 2019-09-08 DIAGNOSIS — R339 Retention of urine, unspecified: Secondary | ICD-10-CM | POA: Diagnosis not present

## 2019-09-08 DIAGNOSIS — N183 Chronic kidney disease, stage 3 unspecified: Secondary | ICD-10-CM | POA: Diagnosis not present

## 2019-09-14 ENCOUNTER — Encounter: Payer: Self-pay | Admitting: Family Medicine

## 2019-09-22 DIAGNOSIS — E1122 Type 2 diabetes mellitus with diabetic chronic kidney disease: Secondary | ICD-10-CM | POA: Diagnosis present

## 2019-09-22 DIAGNOSIS — L089 Local infection of the skin and subcutaneous tissue, unspecified: Secondary | ICD-10-CM | POA: Diagnosis not present

## 2019-09-22 DIAGNOSIS — R6520 Severe sepsis without septic shock: Secondary | ICD-10-CM | POA: Diagnosis not present

## 2019-09-22 DIAGNOSIS — R339 Retention of urine, unspecified: Secondary | ICD-10-CM | POA: Diagnosis not present

## 2019-09-22 DIAGNOSIS — T8361XA Infection and inflammatory reaction due to implanted penile prosthesis, initial encounter: Secondary | ICD-10-CM | POA: Diagnosis not present

## 2019-09-22 DIAGNOSIS — N183 Chronic kidney disease, stage 3 unspecified: Secondary | ICD-10-CM | POA: Diagnosis present

## 2019-09-22 DIAGNOSIS — S30851A Superficial foreign body of abdominal wall, initial encounter: Secondary | ICD-10-CM | POA: Diagnosis not present

## 2019-09-22 DIAGNOSIS — N1330 Unspecified hydronephrosis: Secondary | ICD-10-CM | POA: Diagnosis not present

## 2019-09-22 DIAGNOSIS — R531 Weakness: Secondary | ICD-10-CM | POA: Diagnosis not present

## 2019-09-22 DIAGNOSIS — Z888 Allergy status to other drugs, medicaments and biological substances status: Secondary | ICD-10-CM | POA: Diagnosis not present

## 2019-09-22 DIAGNOSIS — Z794 Long term (current) use of insulin: Secondary | ICD-10-CM | POA: Diagnosis not present

## 2019-09-22 DIAGNOSIS — Z20828 Contact with and (suspected) exposure to other viral communicable diseases: Secondary | ICD-10-CM | POA: Diagnosis present

## 2019-09-22 DIAGNOSIS — Z906 Acquired absence of other parts of urinary tract: Secondary | ICD-10-CM | POA: Diagnosis not present

## 2019-09-22 DIAGNOSIS — A4189 Other specified sepsis: Secondary | ICD-10-CM | POA: Diagnosis not present

## 2019-09-22 DIAGNOSIS — E871 Hypo-osmolality and hyponatremia: Secondary | ICD-10-CM | POA: Diagnosis not present

## 2019-09-22 DIAGNOSIS — J9811 Atelectasis: Secondary | ICD-10-CM | POA: Diagnosis not present

## 2019-09-22 DIAGNOSIS — I129 Hypertensive chronic kidney disease with stage 1 through stage 4 chronic kidney disease, or unspecified chronic kidney disease: Secondary | ICD-10-CM | POA: Diagnosis present

## 2019-09-22 DIAGNOSIS — L03311 Cellulitis of abdominal wall: Secondary | ICD-10-CM | POA: Diagnosis not present

## 2019-09-22 DIAGNOSIS — R9431 Abnormal electrocardiogram [ECG] [EKG]: Secondary | ICD-10-CM | POA: Diagnosis not present

## 2019-09-22 DIAGNOSIS — Z8551 Personal history of malignant neoplasm of bladder: Secondary | ICD-10-CM | POA: Diagnosis not present

## 2019-09-22 DIAGNOSIS — I700 Atherosclerosis of aorta: Secondary | ICD-10-CM | POA: Diagnosis not present

## 2019-09-22 DIAGNOSIS — N179 Acute kidney failure, unspecified: Secondary | ICD-10-CM | POA: Diagnosis present

## 2019-09-22 DIAGNOSIS — R000 Tachycardia, unspecified: Secondary | ICD-10-CM | POA: Diagnosis not present

## 2019-09-22 DIAGNOSIS — R509 Fever, unspecified: Secondary | ICD-10-CM | POA: Diagnosis not present

## 2019-09-22 DIAGNOSIS — T83591A Infection and inflammatory reaction due to implanted urinary sphincter, initial encounter: Secondary | ICD-10-CM | POA: Diagnosis present

## 2019-09-22 DIAGNOSIS — R1031 Right lower quadrant pain: Secondary | ICD-10-CM | POA: Diagnosis not present

## 2019-09-22 DIAGNOSIS — D62 Acute posthemorrhagic anemia: Secondary | ICD-10-CM | POA: Diagnosis not present

## 2019-09-22 DIAGNOSIS — A419 Sepsis, unspecified organism: Secondary | ICD-10-CM | POA: Diagnosis not present

## 2019-09-23 DIAGNOSIS — A419 Sepsis, unspecified organism: Secondary | ICD-10-CM | POA: Insufficient documentation

## 2019-09-23 DIAGNOSIS — T83591A Infection and inflammatory reaction due to implanted urinary sphincter, initial encounter: Secondary | ICD-10-CM | POA: Insufficient documentation

## 2019-09-25 MED ORDER — INSULIN REGULAR HUMAN 100 UNIT/ML IJ SOLN
15.00 | INTRAMUSCULAR | Status: DC
Start: 2019-09-26 — End: 2019-09-25

## 2019-09-25 MED ORDER — BISACODYL 10 MG RE SUPP
10.00 | RECTAL | Status: DC
Start: ? — End: 2019-09-25

## 2019-09-25 MED ORDER — GABAPENTIN 300 MG PO CAPS
300.00 | ORAL_CAPSULE | ORAL | Status: DC
Start: 2019-09-25 — End: 2019-09-25

## 2019-09-25 MED ORDER — TRAMADOL HCL 50 MG PO TABS
50.00 | ORAL_TABLET | ORAL | Status: DC
Start: ? — End: 2019-09-25

## 2019-09-25 MED ORDER — ACETAMINOPHEN 325 MG PO TABS
650.00 | ORAL_TABLET | ORAL | Status: DC
Start: 2019-09-25 — End: 2019-09-25

## 2019-09-25 MED ORDER — INSULIN LISPRO 100 UNIT/ML ~~LOC~~ SOLN
2.00 | SUBCUTANEOUS | Status: DC
Start: 2019-09-25 — End: 2019-09-25

## 2019-09-25 MED ORDER — DEXTROSE 10 % IV SOLN
125.00 | INTRAVENOUS | Status: DC
Start: ? — End: 2019-09-25

## 2019-09-25 MED ORDER — KETOROLAC TROMETHAMINE 15 MG/ML IJ SOLN
15.00 | INTRAMUSCULAR | Status: DC
Start: ? — End: 2019-09-25

## 2019-09-25 MED ORDER — ONDANSETRON HCL 4 MG/2ML IJ SOLN
4.00 | INTRAMUSCULAR | Status: DC
Start: ? — End: 2019-09-25

## 2019-09-25 MED ORDER — GLUCOSE 40 % PO GEL
15.00 | ORAL | Status: DC
Start: ? — End: 2019-09-25

## 2019-09-25 MED ORDER — GENERIC EXTERNAL MEDICATION
1.00 | Status: DC
Start: 2019-09-26 — End: 2019-09-25

## 2019-09-25 MED ORDER — LACTATED RINGERS IV SOLN
INTRAVENOUS | Status: DC
Start: ? — End: 2019-09-25

## 2019-09-25 MED ORDER — POLYETHYLENE GLYCOL 3350 17 G PO PACK
17.00 | PACK | ORAL | Status: DC
Start: 2019-09-26 — End: 2019-09-25

## 2019-09-25 MED ORDER — ZOLPIDEM TARTRATE 5 MG PO TABS
5.00 | ORAL_TABLET | ORAL | Status: DC
Start: ? — End: 2019-09-25

## 2019-10-20 ENCOUNTER — Encounter: Payer: Self-pay | Admitting: Family Medicine

## 2019-10-20 ENCOUNTER — Other Ambulatory Visit: Payer: Self-pay

## 2019-10-20 ENCOUNTER — Ambulatory Visit (INDEPENDENT_AMBULATORY_CARE_PROVIDER_SITE_OTHER): Payer: Medicare Other | Admitting: Family Medicine

## 2019-10-20 VITALS — BP 115/79 | HR 76 | Temp 98.3°F | Resp 16 | Ht 73.0 in | Wt 211.2 lb

## 2019-10-20 DIAGNOSIS — N184 Chronic kidney disease, stage 4 (severe): Secondary | ICD-10-CM

## 2019-10-20 DIAGNOSIS — E7800 Pure hypercholesterolemia, unspecified: Secondary | ICD-10-CM | POA: Diagnosis not present

## 2019-10-20 DIAGNOSIS — D649 Anemia, unspecified: Secondary | ICD-10-CM

## 2019-10-20 DIAGNOSIS — E1149 Type 2 diabetes mellitus with other diabetic neurological complication: Secondary | ICD-10-CM

## 2019-10-20 DIAGNOSIS — N139 Obstructive and reflux uropathy, unspecified: Secondary | ICD-10-CM | POA: Diagnosis not present

## 2019-10-20 DIAGNOSIS — N179 Acute kidney failure, unspecified: Secondary | ICD-10-CM | POA: Diagnosis not present

## 2019-10-20 DIAGNOSIS — F5101 Primary insomnia: Secondary | ICD-10-CM

## 2019-10-20 LAB — CBC WITH DIFFERENTIAL/PLATELET
Basophils Absolute: 0.1 10*3/uL (ref 0.0–0.1)
Basophils Relative: 1 % (ref 0.0–3.0)
Eosinophils Absolute: 0 10*3/uL (ref 0.0–0.7)
Eosinophils Relative: 0.9 % (ref 0.0–5.0)
HCT: 33.7 % — ABNORMAL LOW (ref 39.0–52.0)
Hemoglobin: 11.1 g/dL — ABNORMAL LOW (ref 13.0–17.0)
Lymphocytes Relative: 22 % (ref 12.0–46.0)
Lymphs Abs: 1.3 10*3/uL (ref 0.7–4.0)
MCHC: 32.9 g/dL (ref 30.0–36.0)
MCV: 89.2 fl (ref 78.0–100.0)
Monocytes Absolute: 1.1 10*3/uL — ABNORMAL HIGH (ref 0.1–1.0)
Monocytes Relative: 19.2 % — ABNORMAL HIGH (ref 3.0–12.0)
Neutro Abs: 3.3 10*3/uL (ref 1.4–7.7)
Neutrophils Relative %: 56.9 % (ref 43.0–77.0)
Platelets: 161 10*3/uL (ref 150.0–400.0)
RBC: 3.78 Mil/uL — ABNORMAL LOW (ref 4.22–5.81)
RDW: 15.2 % (ref 11.5–15.5)
WBC: 5.7 10*3/uL (ref 4.0–10.5)

## 2019-10-20 LAB — LIPID PANEL
Cholesterol: 152 mg/dL (ref 0–200)
HDL: 36.3 mg/dL — ABNORMAL LOW (ref >39.00)
LDL Cholesterol: 98 mg/dL (ref 0–99)
NonHDL: 115.27
Total CHOL/HDL Ratio: 4
Triglycerides: 85 mg/dL (ref 0.0–149.0)
VLDL: 17 mg/dL (ref 0.0–40.0)

## 2019-10-20 LAB — COMPREHENSIVE METABOLIC PANEL
ALT: 38 U/L (ref 0–53)
AST: 30 U/L (ref 0–37)
Albumin: 3.7 g/dL (ref 3.5–5.2)
Alkaline Phosphatase: 73 U/L (ref 39–117)
BUN: 41 mg/dL — ABNORMAL HIGH (ref 6–23)
CO2: 24 mEq/L (ref 19–32)
Calcium: 9 mg/dL (ref 8.4–10.5)
Chloride: 106 mEq/L (ref 96–112)
Creatinine, Ser: 2.94 mg/dL — ABNORMAL HIGH (ref 0.40–1.50)
GFR: 21.05 mL/min — ABNORMAL LOW (ref >60.00)
Glucose, Bld: 124 mg/dL — ABNORMAL HIGH (ref 70–99)
Potassium: 4.4 mEq/L (ref 3.5–5.1)
Sodium: 136 mEq/L (ref 135–145)
Total Bilirubin: 0.4 mg/dL (ref 0.2–1.2)
Total Protein: 7.6 g/dL (ref 6.0–8.3)

## 2019-10-20 LAB — HEMOGLOBIN A1C: Hgb A1c MFr Bld: 8.1 % — ABNORMAL HIGH (ref 4.6–6.5)

## 2019-10-20 NOTE — Progress Notes (Addendum)
Office Note 10/20/2019  CC:  Chief Complaint  Patient presents with  . Follow-up    RCI, pt is fasting    HPI:  Alexander [name] is a 73 y.o. White male who is here for f/u after a long absence. He was hospitalized 10/9-10/14/2020 with critical illness: AUR->renal failure, also incidentally had a covid 19 POS test. He was d/c'd home with indwelling catheter due to malfunctioning of his artificial sphincter/ileal loop diversion/hx of cystectomy.  He ended up getting his artificial sphincter out.  Still with indwelling catheter now. He did not develop any sx's c/w covid infection. No UTI detected. He was instructed to f/u with me in 1 wk but did not do this.   Has DM, hx of somewhat good control, tendency to self adjust meds in ways that I have not suggested prior.  Has DPN. HLD: he is intolerant of statin and refuses further trial. Hx of inc LFTs in hosp recently, were trending down by d/c.  Interim hx: Says he is feeling well. No problems with urine draining from catheter.  Has surgical wound in perineum he's still packing and he goes back to [facility] to get this checked out soon. Taking ambien nightly for sleep.  No adverse side effects. PMP AWARE reviewed today: most recent rx for ambien was filled 07/27/19, # 24, rx by me. No red flags.   DM: Novolin N 30 U once a day and 20 U later in day. Has Novolin R but only takes in case "emergency". Glucose monitoring: glucoses--he can't be clear how often he checks it or what the numbers are.  Past Medical History:  Diagnosis Date  . Acute kidney injury ([place]) 08/2019   COVID 19 resp failure hospitalization  . Anemia 08/2019  . Bladder cancer ([place]) 1996   Remission  . Chronic renal insufficiency, stage III (moderate)    Borderline stage 4 (CrCl @30 ).  Obstructive uropathy + medicorenal dz:  R>L hydroureteronephrosis 2009; no mass , just chronic poor emptying of neobladder) + diabetic nephropathy.  As of 10/2018, as renal function  declined again, he refused to see a nephrologist.  . COVID-19 08/2019   tested when he had NO SYMPTOMS->tested per protocol upon admission to hosp for acute renal failure secondary to urinary retention/obstructive uropathy. He never developed any covid 19 symptoms (wife had significant resp failure from covid, though).  . Diabetes mellitus with complication (HCC)    CXK4Y 6.5% on 02/27/10.  Hx of 384 mg on 24h urine.  DPN 2020.  . Diabetic peripheral neuropathy ([place])   . ED (erectile dysfunction)   . Elevated transaminase level 2013;2020   2013 Abd u/s nl; viral hep serologies neg.  2020->mild, noted during hospitalization for AKI on CRI (secondary to acute urinary retention).  . Hyperlipidemia    Hypertriglyceridemia (on and off meds over the years).  Statin intolerant  . Hypertension   . Insomnia   . Plantar fasciitis    Steroid injection by Dr. Paulla Dolly 02/2019  . 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 CARDIOVASCULAR STRESS TEST  04/2013   Normal myocardial perfusion stress test.  . CYSTECTOMY  1997   with continent urinary reservoir (ileal loop) and artificial urinary sphincter placement-[facility]   . URINARY SPHINCTER REVISION     [facility] 2018    History reviewed. No pertinent family history.  Social History   Socioeconomic History  . Marital status: Married    Spouse name: Not on  file  . Number of children: Not on file  . Years of education: Not on file  . Highest education level: Not on file  Occupational History  . Not on file  Tobacco Use  . Smoking status: Former Smoker    Quit date: 11/02/1985    Years since quitting: 33.9  . Smokeless tobacco: Never Used  Substance and Sexual Activity  . Alcohol use: No  . Drug use: No  . Sexual activity: Not on file  Other Topics Concern  . Not on file  Social History Narrative   Married, no biologic children but 2 adopted children from [place].   Retired Production manager, now  in full time ministry/missionary.   Living in [place],  as of 10/2018.   No T/A/Ds.      Regular exercise--on and off.  Diet: highly variable.      Social Determinants of Health   Financial Resource Strain:   . Difficulty of Paying Living Expenses: Not on file  Food Insecurity:   . Worried About Charity fundraiser in the Last Year: Not on file  . Ran Out of Food in the Last Year: Not on file  Transportation Needs:   . Lack of Transportation (Medical): Not on file  . Lack of Transportation (Non-Medical): Not on file  Physical Activity:   . Days of Exercise per Week: Not on file  . Minutes of Exercise per Session: Not on file  Stress:   . Feeling of Stress : Not on file  Social Connections:   . Frequency of Communication with Friends and Family: Not on file  . Frequency of Social Gatherings with Friends and Family: Not on file  . Attends Religious Services: Not on file  . Active Member of Clubs or Organizations: Not on file  . Attends Archivist Meetings: Not on file  . Marital Status: Not on file  Intimate Partner Violence:   . Fear of Current or Ex-Partner: Not on file  . Emotionally Abused: Not on file  . Physically Abused: Not on file  . Sexually Abused: Not on file    Outpatient Medications Prior to Visit  Medication Sig Dispense Refill  . Accu-Chek FastClix Lancets MISC Use to check blood sugar 2 times daily 204 each 3  . ASCORBIC ACID PO Take by mouth daily.    Marland Kitchen aspirin 81 MG tablet Take 81 mg by mouth daily.      . B Complex Vitamins (VITAMIN B COMPLEX PO) once daily.    . Coenzyme Q10 50 MG CAPS Take 150 mg by mouth daily.    Marland Kitchen glucose blood (ACCU-CHEK SMARTVIEW) test strip Use to check blood sugar 2 times daily 200 each 3  . insulin NPH Human (NOVOLIN N RELION) 100 UNIT/ML injection 70 U SQ q supper 10 mL 5  . Insulin Pen Needle 32G X 6 MM MISC Use for your pen insulin injection 4 times per day 100 each 10  . insulin regular (NOVOLIN R RELION) 100  units/mL injection 20-30 U SQ q Supper 10 mL 6  . Insulin Syringe-Needle U-100 (BD INSULIN SYRINGE ULTRAFINE) 31G X 5/16" 1 ML MISC Use as directed to administer your 70/30 insulin.  DX: 250.00 100 each prn  . losartan (COZAAR) 50 MG tablet TAKE 1 TABLET BY MOUTH DAILY. 90 tablet 3  . Omega-3 Fatty Acids (FISH OIL) 500 MG CAPS Take by mouth daily.    . vitamin E 1000 UNIT capsule Take 10,000 Units by mouth daily.    Marland Kitchen  zolpidem (AMBIEN) 10 MG tablet Take 1 tablet (10 mg total) by mouth at bedtime as needed for sleep. 90 tablet 1  . cyclobenzaprine (FLEXERIL) 10 MG tablet Take 1 tablet (10 mg total) by mouth 3 (three) times daily as needed for muscle spasms. (Patient not taking: Reported on 10/20/2019) 30 tablet 0  . Multiple Vitamin (MULTIVITAMIN) tablet Take 1 tablet by mouth daily.       No facility-administered medications prior to visit.    Allergies  Allergen Reactions  . Atorvastatin Other (See Comments)  . Gabapentin     Other reaction(s): Other (See Comments) Doesn't like side effects (prefer not to take)  . Lisinopril Cough  . Morphine     Other reaction(s): Hallucination    ROS Review of Systems  Constitutional: Negative for fatigue and fever.  HENT: Negative for congestion and sore throat.   Eyes: Negative for visual disturbance.  Respiratory: Negative for cough.   Cardiovascular: Negative for chest pain.  Gastrointestinal: Negative for abdominal pain and nausea.  Genitourinary: Negative for dysuria.  Musculoskeletal: Negative for back pain and joint swelling.  Skin: Negative for rash.  Neurological: Negative for weakness and headaches.  Hematological: Negative for adenopathy.  Psychiatric/Behavioral: Negative for dysphoric mood. The patient is not nervous/anxious.     PE; Blood pressure 115/79, pulse 76, temperature 98.3 F (36.8 C), temperature source Temporal, resp. rate 16, height 6\' 1"  (1.854 m), weight 211 lb 3.2 oz (95.8 kg), SpO2 98 %. Gen: Alert, well  appearing.  Patient is oriented to person, place, time, and situation. AFFECT: pleasant, lucid thought and speech. CV: RRR, no m/r/g.   LUNGS: CTA bilat, nonlabored resps, good aeration in all lung fields. ABD: soft, NT/ND EXT: no clubbing or cyanosis.  no edema.   Pertinent labs:  Lab Results  Component Value Date   TSH 0.63 08/24/2011   Lab Results  Component Value Date   WBC 4.8 08/16/2019   HGB 9.9 (A) 08/16/2019   HCT 30 (A) 08/16/2019   MCV 87.7 10/28/2018   PLT 149 (A) 08/16/2019   Lab Results  Component Value Date   IRON 95 11/07/2018   IRON 91 11/07/2018   TIBC 317 11/07/2018   FERRITIN 153.5 11/07/2018   No results found for: VITAMINB12  Lab Results  Component Value Date   CREATININE 2.3 (A) 08/16/2019   BUN 40 (A) 08/16/2019   NA 4 (A) 08/16/2019   NA 138 08/16/2019   K 4.0 08/16/2019   CL 108 07/27/2019   CO2 24 07/27/2019   Lab Results  Component Value Date   ALT 73 (A) 08/16/2019   AST 58 (A) 08/16/2019   ALKPHOS 60 10/28/2018   BILITOT 0.3 10/28/2018   Lab Results  Component Value Date   CHOL 204 (H) 10/28/2018   Lab Results  Component Value Date   HDL 34.40 (L) 10/28/2018   Lab Results  Component Value Date   LDLCALC 144 (H) 10/28/2018   Lab Results  Component Value Date   TRIG 129.0 10/28/2018   Lab Results  Component Value Date   CHOLHDL 6 10/28/2018   Lab Results  Component Value Date   HGBA1C 6.5 07/27/2019     ASSESSMENT AND PLAN:   1) Obstructive uropathy: urine draining well with indwelling cath. Still not sure of plan from here, but [facility] urol likely plans on either doing another diversion/artificial sphincter procedure vs suprapubic cath.  He has f/u with them very soon. Cr/lytes today.  2) Normocytic anemia: this  was detected when in hosp for AUR. Recheck CBC and check iron panel today.  3) DM: control good as per last a1c. As usual, he adjusts his insulin the way he sees fit, and I really can't assess what his  home glucoses are from what he tells me today. HbA1c today.  4) Hyperchol: monitor FLP and hepatic panel today. He refuses statins but maybe I can offer zetia in future.  5) Mild transaminasemia: while in hosp; these were trending down and were likely related to mild MOSD, expect normal by now.  Hepatic panel check today.  6) Insomnia: has responded well to long term use of ambien. No new rx for this med needed today.  An After Visit Summary was printed and given to the patient.  FOLLOW UP:  Return in about 6 months (around 04/19/2020) for routine chronic illness f/u.  Signed:  Crissie Sickles, MD           10/20/2019

## 2019-10-21 LAB — IRON,TIBC AND FERRITIN PANEL
%SAT: 48 % (calc) (ref 20–48)
Ferritin: 198 ng/mL (ref 24–380)
Iron: 150 ug/dL (ref 50–180)
TIBC: 313 mcg/dL (calc) (ref 250–425)

## 2019-10-24 ENCOUNTER — Ambulatory Visit: Payer: Medicare Other

## 2019-10-24 ENCOUNTER — Other Ambulatory Visit: Payer: Self-pay | Admitting: Family Medicine

## 2019-10-24 DIAGNOSIS — D72821 Monocytosis (symptomatic): Secondary | ICD-10-CM

## 2019-10-24 NOTE — Addendum Note (Signed)
Addended by: Ralph Dowdy on: 10/24/2019 12:19 PM   Modules accepted: Orders

## 2019-10-25 ENCOUNTER — Ambulatory Visit: Payer: Medicare Other

## 2019-10-25 ENCOUNTER — Other Ambulatory Visit: Payer: Self-pay

## 2019-10-25 DIAGNOSIS — D72821 Monocytosis (symptomatic): Secondary | ICD-10-CM

## 2019-10-26 LAB — CBC WITH DIFFERENTIAL/PLATELET
Absolute Monocytes: 749 cells/uL (ref 200–950)
Basophils Absolute: 11 cells/uL (ref 0–200)
Basophils Relative: 0.3 %
Eosinophils Absolute: 61 cells/uL (ref 15–500)
Eosinophils Relative: 1.6 %
HCT: 34.5 % — ABNORMAL LOW (ref 38.5–50.0)
Hemoglobin: 11.2 g/dL — ABNORMAL LOW (ref 13.2–17.1)
Lymphs Abs: 1174 cells/uL (ref 850–3900)
MCH: 28.9 pg (ref 27.0–33.0)
MCHC: 32.5 g/dL (ref 32.0–36.0)
MCV: 88.9 fL (ref 80.0–100.0)
MPV: 11.1 fL (ref 7.5–12.5)
Monocytes Relative: 19.7 %
Neutro Abs: 1805 cells/uL (ref 1500–7800)
Neutrophils Relative %: 47.5 %
Platelets: 164 10*3/uL (ref 140–400)
RBC: 3.88 10*6/uL — ABNORMAL LOW (ref 4.20–5.80)
RDW: 14.4 % (ref 11.0–15.0)
Total Lymphocyte: 30.9 %
WBC: 3.8 10*3/uL (ref 3.8–10.8)

## 2019-10-26 LAB — PATHOLOGIST SMEAR REVIEW

## 2019-11-24 ENCOUNTER — Ambulatory Visit: Payer: Medicare Other | Admitting: Orthotics

## 2019-11-24 ENCOUNTER — Ambulatory Visit (INDEPENDENT_AMBULATORY_CARE_PROVIDER_SITE_OTHER): Payer: Medicare Other | Admitting: Podiatry

## 2019-11-24 ENCOUNTER — Other Ambulatory Visit: Payer: Self-pay

## 2019-11-24 DIAGNOSIS — M2012 Hallux valgus (acquired), left foot: Secondary | ICD-10-CM

## 2019-11-24 DIAGNOSIS — M2041 Other hammer toe(s) (acquired), right foot: Secondary | ICD-10-CM

## 2019-11-24 DIAGNOSIS — M2011 Hallux valgus (acquired), right foot: Secondary | ICD-10-CM | POA: Diagnosis not present

## 2019-11-24 DIAGNOSIS — M2042 Other hammer toe(s) (acquired), left foot: Secondary | ICD-10-CM

## 2019-11-24 DIAGNOSIS — E1151 Type 2 diabetes mellitus with diabetic peripheral angiopathy without gangrene: Secondary | ICD-10-CM | POA: Diagnosis not present

## 2019-11-24 NOTE — Progress Notes (Signed)
  Subjective:  Patient ID: Alexander [name], male    DOB: 1946-03-20,  MRN: [id number]  Chief Complaint  Patient presents with  . Diabetes Mellitus    Pt to discuss diabetic shoes. Pt states previous pair fit well and worked effectively.     74 y.o. male presents with the above complaint. History confirmed with patient. Hx as above.  Objective:  Physical Exam: warm, good capillary refill, no trophic changes or ulcerative lesions, normal DP pulsed, normal sensory exam, normal monofilament, and PT reduced bilateral. Left Foot: bunion deformity noted and hammertoes  Right Foot: bunion deformity noted and hammertoes, hallux rigidus   Assessment:   1. Acquired hallux valgus of both feet   2. Hammer toes of both feet   3. Diabetes mellitus type 2 with peripheral artery disease ([place])    Plan:  Patient was evaluated and treated and all questions answered.  Diabetes and PAD  -Comprehensive DM exam performed. -Patient is diabetic with a qualifying condition for diabetic shoes. -Patient was molded for DM shoes today by orthotist Velora Heckler  Return if symptoms worsen or fail to improve.

## 2019-11-27 ENCOUNTER — Telehealth: Payer: Self-pay | Admitting: Family Medicine

## 2019-11-27 MED ORDER — SULFAMETHOXAZOLE-TRIMETHOPRIM 800-160 MG PO TABS: 1.0000 | ORAL_TABLET | Freq: Two times a day (BID) | ORAL | 2 refills | Status: DC

## 2019-11-27 NOTE — Telephone Encounter (Signed)
Ok, SMZ/TMP eRx'd.

## 2019-11-27 NOTE — Telephone Encounter (Signed)
He was last given rx for Bactrim DS on 09/10/15 (10,4) and ended 10/25/15 due to completion of course. He would like refill to keep on hand if needed.   Please advise, thanks.

## 2019-11-27 NOTE — Telephone Encounter (Signed)
Patient advised RX sent.

## 2019-11-27 NOTE — Telephone Encounter (Signed)
Patient requesting refill on prescription to keep on hand if needed. Very old prescription.  This is how it reads on med bottle:   SMZ / TMP  DS  800  160   TAB  Patient states he takes for bladder infections. He does not have one now.  [facility] [address]

## 2019-12-01 MED ORDER — CEFDINIR 300 MG PO CAPS: 300.0000 mg | ORAL_CAPSULE | Freq: Two times a day (BID) | ORAL | 0 refills | Status: DC

## 2019-12-01 NOTE — Telephone Encounter (Signed)
Stop cipro. I sent in cefdinir to take twice a day for the next 7d.   Call if not better on Monday, BUT if getting fever, nausea, abd pain, or mid back pain on one side then seek medical attention over the weekend. -thx

## 2019-12-01 NOTE — Telephone Encounter (Signed)
Patient advised medication has been sent and PCP recommendations.

## 2019-12-01 NOTE — Telephone Encounter (Signed)
Patient is not any better. He wants to know what to do since its Friday, going to the weekend.  Please advise.  Patient can be reached at [phone].

## 2019-12-01 NOTE — Telephone Encounter (Signed)
Patient states he took bactrim for 2 days and it made the infection worse so he has been taking an old rx for cipro 500mg . I did not see this med on prior med list anywhere.   Please advise, thanks.

## 2020-01-02 ENCOUNTER — Telehealth: Payer: Self-pay

## 2020-01-02 NOTE — Telephone Encounter (Signed)
Received forms for therapeutic footwear, Placed on PCP desk to review and sign, if appropriate.

## 2020-01-08 NOTE — Telephone Encounter (Signed)
Paperwork signed.

## 2020-02-05 ENCOUNTER — Other Ambulatory Visit: Payer: Self-pay | Admitting: Family Medicine

## 2020-02-05 NOTE — Telephone Encounter (Signed)
Requesting:zolpidem Contract:n/a UDS:n/a Last Visit:10/20/19 Next Visit:advised to f/u June Last Refill:07/27/19(90,1)  Please Advise. Medication pending

## 2020-02-06 NOTE — Telephone Encounter (Signed)
Rx sent 

## 2020-02-13 ENCOUNTER — Ambulatory Visit (INDEPENDENT_AMBULATORY_CARE_PROVIDER_SITE_OTHER): Payer: Medicare Other | Admitting: Orthotics

## 2020-02-13 DIAGNOSIS — M2012 Hallux valgus (acquired), left foot: Secondary | ICD-10-CM | POA: Diagnosis not present

## 2020-02-13 DIAGNOSIS — E1151 Type 2 diabetes mellitus with diabetic peripheral angiopathy without gangrene: Secondary | ICD-10-CM

## 2020-02-13 DIAGNOSIS — M722 Plantar fascial fibromatosis: Secondary | ICD-10-CM

## 2020-02-13 DIAGNOSIS — M2011 Hallux valgus (acquired), right foot: Secondary | ICD-10-CM

## 2020-02-13 DIAGNOSIS — M2041 Other hammer toe(s) (acquired), right foot: Secondary | ICD-10-CM | POA: Diagnosis not present

## 2020-02-13 DIAGNOSIS — M2042 Other hammer toe(s) (acquired), left foot: Secondary | ICD-10-CM | POA: Diagnosis not present

## 2020-02-13 NOTE — Progress Notes (Signed)

## 2020-04-11 ENCOUNTER — Other Ambulatory Visit: Payer: Self-pay

## 2020-04-11 ENCOUNTER — Ambulatory Visit (INDEPENDENT_AMBULATORY_CARE_PROVIDER_SITE_OTHER): Payer: Medicare Other | Admitting: Family Medicine

## 2020-04-11 ENCOUNTER — Encounter: Payer: Self-pay | Admitting: Family Medicine

## 2020-04-11 VITALS — BP 114/72 | HR 65 | Temp 98.0°F | Resp 16 | Ht 73.0 in | Wt 220.6 lb

## 2020-04-11 DIAGNOSIS — N184 Chronic kidney disease, stage 4 (severe): Secondary | ICD-10-CM | POA: Diagnosis not present

## 2020-04-11 DIAGNOSIS — E119 Type 2 diabetes mellitus without complications: Secondary | ICD-10-CM

## 2020-04-11 DIAGNOSIS — D649 Anemia, unspecified: Secondary | ICD-10-CM

## 2020-04-11 DIAGNOSIS — E7800 Pure hypercholesterolemia, unspecified: Secondary | ICD-10-CM | POA: Diagnosis not present

## 2020-04-11 DIAGNOSIS — G4733 Obstructive sleep apnea (adult) (pediatric): Secondary | ICD-10-CM

## 2020-04-11 LAB — LIPID PANEL
Cholesterol: 143 mg/dL (ref 0–200)
HDL: 30.1 mg/dL — ABNORMAL LOW (ref >39.00)
LDL Cholesterol: 93 mg/dL (ref 0–99)
NonHDL: 112.54
Total CHOL/HDL Ratio: 5
Triglycerides: 99 mg/dL (ref 0.0–149.0)
VLDL: 19.8 mg/dL (ref 0.0–40.0)

## 2020-04-11 LAB — COMPREHENSIVE METABOLIC PANEL
ALT: 35 U/L (ref 0–53)
AST: 25 U/L (ref 0–37)
Albumin: 4.1 g/dL (ref 3.5–5.2)
Alkaline Phosphatase: 82 U/L (ref 39–117)
BUN: 48 mg/dL — ABNORMAL HIGH (ref 6–23)
CO2: 23 mEq/L (ref 19–32)
Calcium: 8.9 mg/dL (ref 8.4–10.5)
Chloride: 107 mEq/L (ref 96–112)
Creatinine, Ser: 3.3 mg/dL — ABNORMAL HIGH (ref 0.40–1.50)
GFR: 18.39 mL/min — ABNORMAL LOW (ref >60.00)
Glucose, Bld: 63 mg/dL — ABNORMAL LOW (ref 70–99)
Potassium: 4.8 mEq/L (ref 3.5–5.1)
Sodium: 136 mEq/L (ref 135–145)
Total Bilirubin: 0.4 mg/dL (ref 0.2–1.2)
Total Protein: 8 g/dL (ref 6.0–8.3)

## 2020-04-11 LAB — CBC WITH DIFFERENTIAL/PLATELET
Basophils Absolute: 0.1 10*3/uL (ref 0.0–0.1)
Basophils Relative: 1.1 % (ref 0.0–3.0)
Eosinophils Absolute: 0.1 10*3/uL (ref 0.0–0.7)
Eosinophils Relative: 1.4 % (ref 0.0–5.0)
HCT: 34.8 % — ABNORMAL LOW (ref 39.0–52.0)
Hemoglobin: 11.4 g/dL — ABNORMAL LOW (ref 13.0–17.0)
Lymphocytes Relative: 22.2 % (ref 12.0–46.0)
Lymphs Abs: 1.2 10*3/uL (ref 0.7–4.0)
MCHC: 32.9 g/dL (ref 30.0–36.0)
MCV: 88.5 fl (ref 78.0–100.0)
Monocytes Absolute: 1.3 10*3/uL — ABNORMAL HIGH (ref 0.1–1.0)
Monocytes Relative: 23.7 % — ABNORMAL HIGH (ref 3.0–12.0)
Neutro Abs: 2.9 10*3/uL (ref 1.4–7.7)
Neutrophils Relative %: 52.3 % (ref 43.0–77.0)
Platelets: 151 10*3/uL (ref 150.0–400.0)
RBC: 3.93 Mil/uL — ABNORMAL LOW (ref 4.22–5.81)
RDW: 14.8 % (ref 11.5–15.5)
WBC: 5.6 10*3/uL (ref 4.0–10.5)

## 2020-04-11 MED ORDER — CEFDINIR 300 MG PO CAPS: 300.0000 mg | ORAL_CAPSULE | Freq: Two times a day (BID) | ORAL | 5 refills | Status: AC

## 2020-04-11 NOTE — Progress Notes (Signed)
OFFICE VISIT  04/11/2020   CC:  Chief Complaint  Alexander [name] presents with  . Follow-up    RCI, pt is fasting    HPI:    Alexander [name] is a 74 y.o. Caucasian male who presents for 6 mo f/u DM 2, CRI 4, insomnia, HLD, hx of obstructive uropathy, hx of bladder ca in remote past.  A/P as of last visit: "1) Obstructive uropathy: urine draining well with indwelling cath. Still not sure of plan from here, but [facility] urol likely plans on either doing another diversion/artificial sphincter procedure vs suprapubic cath.  He has f/u with them very soon. Cr/lytes today.  2) Normocytic anemia: this was detected when in hosp for AUR. Recheck CBC and check iron panel today.  3) DM: control good as per last a1c. As usual, he adjusts his insulin the way he sees fit, and I really can't assess what his home glucoses are from what he tells me today. HbA1c today.  4) Hyperchol: monitor FLP and hepatic panel today. He refuses statins but maybe I can offer zetia in future.  5) Mild transaminasemia: while in hosp; these were trending down and were likely related to mild MOSD, expect normal by now.  Hepatic panel check today.  6) Insomnia: has responded well to long term use of ambien. No new rx for this med needed today."  INTERIM HX: Feeling fine. Started nutrisystem 3 d/a. Having to decrease his novolin N accordingly.  Not using novolin R any.  He did not go to [facility] since I saw him last. Has to do I/o cath, typically 5 times per day. Says UTI x 2 last 6 mo: his sx's are cloudy/foul urine, sometimes retention, sometimes incontinence. Needs more abx for prn infection.  He has done well long term taking ambien for insomnia.  This helps consistently. PMP AWARE reviewed today: most recent rx for ambien 10mg  was filled 02/08/20, # 70, rx by me.. No red flags.  He c/o chronic bad snoring, long hx of witnessed apnea in sleep. Has never had sleep study.    HTN: no home bp monitoring, "it's always 100s  over 60s".  ROS: no fevers, no CP, no SOB, no wheezing, no cough, no dizziness, no HAs, no rashes, no melena/hematochezia.  No polyuria or polydipsia.  No myalgias or arthralgias.  No focal weakness, paresthesias, or tremors.  No acute vision or hearing abnormalities. No n/v/d or abd pain.  No palpitations.    Past Medical History:  Diagnosis Date  . Acute kidney injury ([place]) 08/2019   COVID 19 resp failure hospitalization  . Anemia 08/2019  . Bladder cancer ([place]) 1996   Remission  . Chronic renal insufficiency, stage III (moderate)    Borderline stage 4 (CrCl @30 ).  Obstructive uropathy + medicorenal dz:  R>L hydroureteronephrosis 2009; no mass , just chronic poor emptying of neobladder) + diabetic nephropathy.  As of 10/2018, as renal function declined again, he refused to see a nephrologist.  . COVID-19 08/2019   tested when he had NO SYMPTOMS->tested per protocol upon admission to hosp for acute renal failure secondary to urinary retention/obstructive uropathy. He never developed any covid 19 symptoms (wife had significant resp failure from covid, though).  . Diabetes mellitus with complication (HCC)    [id number] 6.5% on 02/27/10.  Hx of 384 mg on 24h urine.  DPN 2020.  . Diabetic peripheral neuropathy ([place])   . ED (erectile dysfunction)   . Elevated transaminase level 2013;2020   2013 Abd u/s nl; viral  hep serologies neg.  2020->mild, noted during hospitalization for AKI on CRI (secondary to acute urinary retention).  . Hyperlipidemia    Hypertriglyceridemia (on and off meds over the years).  Statin intolerant  . Hypertension   . Insomnia   . Plantar fasciitis    Steroid injection by Dr. Paulla Dolly 02/2019  . 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 CARDIOVASCULAR STRESS TEST  04/2013   Normal myocardial perfusion stress test.  . CYSTECTOMY  1997   with continent urinary reservoir (ileal loop) and artificial urinary sphincter placement-[facility]   .  URINARY SPHINCTER REVISION     [facility] 2018    Outpatient Medications Prior to Visit  Medication Sig Dispense Refill  . Accu-Chek FastClix Lancets MISC Use to check blood sugar 2 times daily 204 each 3  . ASCORBIC ACID PO Take by mouth daily.    Marland Kitchen aspirin 81 MG tablet Take 81 mg by mouth daily.      . B Complex Vitamins (VITAMIN B COMPLEX PO) once daily.    . Coenzyme Q10 50 MG CAPS Take 150 mg by mouth daily.    Marland Kitchen GLUCOSAMINE-CHONDROITIN PO Take by mouth daily.    Marland Kitchen glucose blood (ACCU-CHEK SMARTVIEW) test strip Use to check blood sugar 2 times daily 200 each 3  . insulin NPH Human (NOVOLIN N RELION) 100 UNIT/ML injection 70 U SQ q supper 10 mL 5  . Insulin Pen Needle 32G X 6 MM MISC Use for your pen insulin injection 4 times per day 100 each 10  . Insulin Syringe-Needle U-100 (BD INSULIN SYRINGE ULTRAFINE) 31G X 5/16" 1 ML MISC Use as directed to administer your 70/30 insulin.  DX: 250.00 100 each prn  . losartan (COZAAR) 50 MG tablet TAKE 1 TABLET BY MOUTH DAILY. 90 tablet 3  . Omega-3 Fatty Acids (FISH OIL) 500 MG CAPS Take by mouth daily.    . vitamin E 1000 UNIT capsule Take 10,000 Units by mouth daily.    Marland Kitchen zolpidem (AMBIEN) 10 MG tablet TAKE 1 TABLET BY MOUTH AT BEDTIME AS NEEDED FOR SLEEP 90 tablet 1  . cyclobenzaprine (FLEXERIL) 10 MG tablet Take 1 tablet (10 mg total) by mouth 3 (three) times daily as needed for muscle spasms. (Alexander [name] not taking: Reported on 04/11/2020) 30 tablet 0  . insulin regular (NOVOLIN R RELION) 100 units/mL injection 20-30 U SQ q Supper (Alexander [name] not taking: Reported on 04/11/2020) 10 mL 6  . cefdinir (OMNICEF) 300 MG capsule Take 1 capsule (300 mg total) by mouth 2 (two) times daily. (Alexander [name] not taking: Reported on 04/11/2020) 14 capsule 0  . sulfamethoxazole-trimethoprim (BACTRIM DS) 800-160 MG tablet Take 1 tablet by mouth 2 (two) times daily. (Alexander [name] not taking: Reported on 04/11/2020) 6 tablet 2   No facility-administered medications prior to visit.     Allergies  Allergen Reactions  . Atorvastatin Other (See Comments)  . Gabapentin     Other reaction(s): Other (See Comments) Doesn't like side effects (prefer not to take)  . Lisinopril Cough  . Morphine     Other reaction(s): Hallucination    ROS As per HPI  PE: Vitals with BMI 04/11/2020 10/20/2019 07/27/2019  Height 6\' 1"  6\' 1"  6\' 1"   Weight 220 lbs 10 oz 211 lbs 3 oz 219 lbs 8 oz  BMI 29.11 32.35 57.32  Systolic 202 542 706  Diastolic 72 79 63  Pulse 65 76 64    Gen: Alert, well appearing.  Alexander [name] is oriented to person, place, time, and situation. AFFECT: pleasant, lucid thought and speech. CV: RRR, no m/r/g.   LUNGS: CTA bilat, nonlabored resps, good aeration in all lung fields. EXT: no clubbing or cyanosis.  no edema.    LABS:  Lab Results  Component Value Date   TSH 0.63 08/24/2011   Lab Results  Component Value Date   WBC 3.8 10/25/2019   HGB 11.2 (L) 10/25/2019   HCT 34.5 (L) 10/25/2019   MCV 88.9 10/25/2019   PLT 164 10/25/2019   Lab Results  Component Value Date   IRON 150 10/20/2019   TIBC 313 10/20/2019   FERRITIN 198 10/20/2019    Lab Results  Component Value Date   CREATININE 2.94 (H) 10/20/2019   BUN 41 (H) 10/20/2019   NA 136 10/20/2019   K 4.4 10/20/2019   CL 106 10/20/2019   CO2 24 10/20/2019   Lab Results  Component Value Date   ALT 38 10/20/2019   AST 30 10/20/2019   ALKPHOS 73 10/20/2019   BILITOT 0.4 10/20/2019   Lab Results  Component Value Date   CHOL 152 10/20/2019   Lab Results  Component Value Date   HDL 36.30 (L) 10/20/2019   Lab Results  Component Value Date   LDLCALC 98 10/20/2019   Lab Results  Component Value Date   TRIG 85.0 10/20/2019   Lab Results  Component Value Date   CHOLHDL 4 10/20/2019   Lab Results  Component Value Date   HGBA1C 8.1 (H) 10/20/2019    IMPRESSION AND PLAN:  1) DM 2; as per his usual, pretty much manages this as he sees fit despite any recommendations I have  given him in the past. HbA1c today. We'll continue on insulin only b/c renal function too impaired to consider oral meds.  2) Chronic urinary retention, hx of radical prostacystectomy with creation of neurobladder and artificial sphincter.  Sphincter no longer functioning. Has to do I/o cath 5 times per day, need new rx to get supplies to accomodate this frequency of self cath. Hx of recurrent UTI.  Due to signif CRI, we'll have to give him cefdinir 300mg  caps to keep on hand and take bid x 3d prn but come in if not improving in 1-2d or if signs of upper tract infection.  He has been good with using abx appropriately.  3) Hyperlipidemia: he repeatedly declines statins but does want to keep up with what his lipids are: FLP and hepatic panel today.  4) CRI stage 4 (GFR 28-30). He is not interested in seeing nephrologist any further at this time but will let me know if he changes his mind.  Lytes/cr today.  5) Insomnia: regular use of zolpidem 10mg  helpful. CSC today. No new rx needed for this med today.  6) OSA suspected: referred to [facility] neuro sleep MD today. Quality of sleep questionnaire today->score 11= high risk.  7) Normocytic anemia: likely from repeated blood draws while in hosp for long period last year for covid.  Last cbc stable, iron levels normal. Rpt CBC w/diff today.  Spent 40 min with pt today, with >50% of this time spent in counseling and care coordination regarding the above problems.  He chooses not to get covid 19 vaccine---he has had covid infection.  An After Visit Summary was printed and given to the Alexander [name].  FOLLOW UP: Return in about 6 months (around 10/11/2020) for routine chronic illness f/u.  Signed:  Crissie Sickles, MD  04/11/2020     

## 2020-04-12 LAB — HEMOGLOBIN A1C: Hgb A1c MFr Bld: 6.5 % (ref 4.6–6.5)

## 2020-04-15 ENCOUNTER — Other Ambulatory Visit: Payer: Self-pay | Admitting: Family Medicine

## 2020-04-15 MED ORDER — CEFDINIR 300 MG PO CAPS: 300.0000 mg | ORAL_CAPSULE | Freq: Two times a day (BID) | ORAL | 5 refills | Status: AC

## 2020-04-17 ENCOUNTER — Telehealth: Payer: Self-pay

## 2020-04-17 NOTE — Telephone Encounter (Signed)
Called [facility] to place new order for intermittent caths and Perla was able to assist with ordering. They will call the patient to verify.

## 2020-04-30 NOTE — Telephone Encounter (Signed)
Received written Rx for catheters, PCP signature to sign off on order. Placed on PCP desk to review and sign, if appropriate.

## 2020-05-01 ENCOUNTER — Other Ambulatory Visit: Payer: Self-pay

## 2020-05-01 ENCOUNTER — Ambulatory Visit (INDEPENDENT_AMBULATORY_CARE_PROVIDER_SITE_OTHER): Payer: Medicare Other | Admitting: Neurology

## 2020-05-01 ENCOUNTER — Encounter: Payer: Self-pay | Admitting: Neurology

## 2020-05-01 VITALS — BP 123/78 | HR 79 | Ht 72.0 in | Wt 217.0 lb

## 2020-05-01 DIAGNOSIS — G479 Sleep disorder, unspecified: Secondary | ICD-10-CM

## 2020-05-01 DIAGNOSIS — R0683 Snoring: Secondary | ICD-10-CM | POA: Diagnosis not present

## 2020-05-01 DIAGNOSIS — E663 Overweight: Secondary | ICD-10-CM

## 2020-05-01 DIAGNOSIS — G4719 Other hypersomnia: Secondary | ICD-10-CM | POA: Diagnosis not present

## 2020-05-01 DIAGNOSIS — G4700 Insomnia, unspecified: Secondary | ICD-10-CM

## 2020-05-01 DIAGNOSIS — R0681 Apnea, not elsewhere classified: Secondary | ICD-10-CM | POA: Diagnosis not present

## 2020-05-01 NOTE — Patient Instructions (Signed)
Thank you for choosing [facility] for your sleep related care! It was nice to meet you today! I appreciate that you entrust me with your sleep related healthcare concerns. I hope, I was able to address at least some of your concerns today, and that I can help you feel reassured and also get better.    Here is what we discussed today and what we came up with as our plan for you:    Based on your symptoms and your exam I believe you are at risk for obstructive sleep apnea (aka OSA), and I think we should proceed with a sleep study to determine whether you do or do not have OSA and how severe it is. As per your preference, we will do a home sleep test.  Even, if you have mild OSA, I may want you to consider treatment with CPAP, as treatment of even borderline or mild sleep apnea can result and improvement of symptoms such as sleep disruption, daytime sleepiness, nighttime bathroom breaks, restless leg symptoms, improvement of headache syndromes, even improved mood disorder.   Please remember, the long-term risks and ramifications of untreated moderate to severe obstructive sleep apnea are: increased Cardiovascular disease, including congestive heart failure, stroke, difficult to control hypertension, treatment resistant obesity, arrhythmias, especially irregular heartbeat commonly known as A. Fib. (atrial fibrillation); even type 2 diabetes has been linked to untreated OSA.   Sleep apnea can cause disruption of sleep and sleep deprivation in most cases, which, in turn, can cause recurrent headaches, problems with memory, mood, concentration, focus, and vigilance. Most people with untreated sleep apnea report excessive daytime sleepiness, which can affect their ability to drive. Please do not drive if you feel sleepy. Patients with sleep apnea developed difficulty initiating and maintaining sleep (aka insomnia).   Having sleep apnea may increase your risk for other sleep disorders,  including involuntary behaviors sleep such as sleep terrors, sleep talking, sleepwalking.    Having sleep apnea can also increase your risk for restless leg syndrome and leg movements at night.   Please note that untreated obstructive sleep apnea may carry additional perioperative morbidity. Patients with significant obstructive sleep apnea (typically, in the moderate to severe degree) should receive, if possible, perioperative PAP (positive airway pressure) therapy and the surgeons and particularly the anesthesiologists should be informed of the diagnosis and the severity of the sleep disordered breathing.   I will likely see you back after your sleep study to go over the test results and where to go from there. We will call you after your sleep study to advise about the results (most likely, you will hear from [facility] my nurse) and to set up an appointment at the time, as necessary.    Our sleep lab administrative assistant will call you to schedule your sleep study and give you further instructions, regarding the check in process for the sleep study, arrival time, what to bring, when you can expect to leave after the study, etc., and to answer any other logistical questions you may have. If you don't hear back from her by about 2 weeks from now, please feel free to call her direct line at [phone] or you can call our general clinic number, or email [place] through My Chart.

## 2020-05-01 NOTE — Progress Notes (Signed)
Subjective:    Patient ID: Alexander [name] is a 74 y.o. male.  HPI     Star Age, MD, PhD [facility] [address]O. [place], [place] [address]  Dear Dr. Anitra Lauth,   I saw your patient, Alexander [name], upon your kind request, in my sleep clinic today for initial consultation of his sleep disorder, in particular, concern for underlying obstructive sleep apnea.  The patient is unaccompanied today.  As you know, Mr. Alexander [name] is a 74 year old right-handed gentleman with an underlying medical history of chronic kidney disease, hypertension, hyperlipidemia, elevated transaminases, bladder cancer, diabetes, neuropathy, anemia, history of COVID-19 infection in 2020, and overweight state, who reports snoring and excessive daytime somnolence, as well as witness apneas. I reviewed your office note from 04/11/2020.  His Epworth sleepiness score is 10/24, fatigue severity score is 24 out of 63.  He was told years ago by a Psychologist, sport and exercise during a mission trip that he should get checked out for sleep apnea.  His wife has noted pauses in breathing.  He snores loudly.  They do not typically sleep in the same bedroom together.  His weight has been mostly stable but he is working on weight loss and in the past month has lost about 10 pounds.  He has chronic difficulty initiating sleep and maintaining sleep.  He goes to bed between 9 and midnight.  He has been on Ambien generic 10 mg strength for the past few years.  He takes it every night.  He may sleep 3 hours consistently but after that he may not be able to sleep with any consolidation.  Rise time may be as early as 3 AM to 8 AM.  He is retired, was a Chief Strategy Officer.  He does not have night to night nocturia or recurrent morning headaches, no family history of OSA.  He quit smoking some 25 to 30 years ago, drinks alcohol very rarely in the form of beer, caffeine in the form of coffee, 2 cups in the mornings typically.  He does not watch  TV in the bedroom.  He lives with his wife, he has 1 adopted daughter and 2 grandchildren, he has occasional restless leg symptoms.  He does not wish to come in for an overnight sleep study and would consider CPAP therapy if he has sleep apnea but wants to do only a home sleep test.   His Past Medical History Is Significant For: Past Medical History:  Diagnosis Date  . Acute kidney injury ([place]) 08/2019   COVID 19 resp failure hospitalization  . Anemia 08/2019  . Bladder cancer ([place]) 1996   Remission  . Chronic renal insufficiency, stage III (moderate)    Borderline stage 4 (CrCl @30 ).  Obstructive uropathy + medicorenal dz:  R>L hydroureteronephrosis 2009; no mass , just chronic poor emptying of neobladder) + diabetic nephropathy.  As of 10/2018, as renal function declined again, he refused to see a nephrologist.  . COVID-19 08/2019   tested when he had NO SYMPTOMS->tested per protocol upon admission to hosp for acute renal failure secondary to urinary retention/obstructive uropathy. He never developed any covid 19 symptoms (wife had significant resp failure from covid, though).  . Diabetes mellitus with complication (HCC)    BJS2G 6.5% on 02/27/10.  Hx of 384 mg on 24h urine.  DPN 2020.  . Diabetic peripheral neuropathy ([place])   . ED (erectile dysfunction)   . Elevated transaminase level 2013;2020   2013 Abd u/s nl;  viral hep serologies neg.  2020->mild, noted during hospitalization for AKI on CRI (secondary to acute urinary retention).  . Hyperlipidemia    Hypertriglyceridemia (on and off meds over the years).  Statin intolerant  . Hypertension   . Insomnia   . Plantar fasciitis    Steroid injection by Dr. Paulla Dolly 02/2019  . 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 CARDIOVASCULAR STRESS TEST  04/2013   Normal myocardial perfusion stress test.  . CYSTECTOMY  1997   with continent urinary reservoir  (ileal loop) and artificial urinary sphincter placement-[facility]   . URINARY SPHINCTER REVISION     [place] 2018    His Family History Is Significant For: No family history on file.  His Social History Is Significant For: Social History   Socioeconomic History  . Marital status: Married    Spouse name: Not on file  . Number of children: Not on file  . Years of education: Not on file  . Highest education level: Not on file  Occupational History  . Not on file  Tobacco Use  . Smoking status: Former Smoker    Quit date: 11/02/1985    Years since quitting: 34.5  . Smokeless tobacco: Never Used  Substance and Sexual Activity  . Alcohol use: No  . Drug use: No  . Sexual activity: Not on file  Other Topics Concern  . Not on file  Social History Narrative   Married, no biologic children but 2 adopted children from [place].   Retired Production manager, now in full time ministry/missionary.   Living in [place] as of 10/2018.   No T/A/Ds.      Regular exercise--on and off.  Diet: highly variable.      Social Determinants of Health   Financial Resource Strain:   . Difficulty of Paying Living Expenses:   Food Insecurity:   . Worried About Charity fundraiser in the Last Year:   . Arboriculturist in the Last Year:   Transportation Needs:   . Film/video editor (Medical):   Marland Kitchen Lack of Transportation (Non-Medical):   Physical Activity:   . Days of Exercise per Week:   . Minutes of Exercise per Session:   Stress:   . Feeling of Stress :   Social Connections:   . Frequency of Communication with Friends and Family:   . Frequency of Social Gatherings with Friends and Family:   . Attends Religious Services:   . Active Member of Clubs or Organizations:   . Attends Archivist Meetings:   Marland Kitchen Marital Status:     His Allergies Are:  Allergies  Allergen Reactions  . Atorvastatin Other (See Comments)  . Gabapentin     Other reaction(s): Other (See  Comments) Doesn't like side effects (prefer not to take)  . Lisinopril Cough  . Morphine     Other reaction(s): Hallucination  :   His Current Medications Are:  Outpatient Encounter Medications as of 05/01/2020  Medication Sig  . Accu-Chek FastClix Lancets MISC Use to check blood sugar 2 times daily  . ASCORBIC ACID PO Take by mouth daily.  Marland Kitchen aspirin 81 MG tablet Take 81 mg by mouth daily.    . B Complex Vitamins (VITAMIN B COMPLEX PO) once daily.  . Coenzyme Q10 50 MG CAPS Take 150 mg by mouth daily.  . cyclobenzaprine (FLEXERIL) 10 MG tablet Take 1 tablet (10  mg total) by mouth 3 (three) times daily as needed for muscle spasms.  Marland Kitchen GLUCOSAMINE-CHONDROITIN PO Take by mouth daily.  Marland Kitchen glucose blood (ACCU-CHEK SMARTVIEW) test strip Use to check blood sugar 2 times daily  . insulin NPH Human (NOVOLIN N RELION) 100 UNIT/ML injection 70 U SQ q supper  . Insulin Pen Needle 32G X 6 MM MISC Use for your pen insulin injection 4 times per day  . insulin regular (NOVOLIN R RELION) 100 units/mL injection 20-30 U SQ q Supper  . Insulin Syringe-Needle U-100 (BD INSULIN SYRINGE ULTRAFINE) 31G X 5/16" 1 ML MISC Use as directed to administer your 70/30 insulin.  DX: 250.00  . losartan (COZAAR) 50 MG tablet TAKE 1 TABLET BY MOUTH DAILY.  Marland Kitchen Omega-3 Fatty Acids (FISH OIL) 500 MG CAPS Take by mouth daily.  . vitamin E 1000 UNIT capsule Take 10,000 Units by mouth daily.  Marland Kitchen zolpidem (AMBIEN) 10 MG tablet TAKE 1 TABLET BY MOUTH AT BEDTIME AS NEEDED FOR SLEEP   No facility-administered encounter medications on file as of 05/01/2020.  :  Review of Systems:  Out of a complete 14 point review of systems, all are reviewed and negative with the exception of these symptoms as listed below: Review of Systems  Neurological:       Here for sleep consult. No prior study- pt only interested in completing hst.   Epworth Sleepiness Scale 0= would never doze 1= slight chance of dozing 2= moderate chance of dozing 3=  high chance of dozing  Sitting and reading:3 Watching TV:3 Sitting inactive in a public place (ex. Theater or meeting):0 As a passenger in a car for an hour without a break:0 Lying down to rest in the afternoon:2 Sitting and talking to someone:0 Sitting quietly after lunch (no alcohol):2 In a car, while stopped in traffic:0 Total:10     Objective:  Neurological Exam  Physical Exam Physical Examination:   Vitals:   05/01/20 1330  BP: 123/78  Pulse: 79    General Examination: The patient is a very pleasant 74 y.o. male in no acute distress. He appears well-developed and well-nourished and well groomed.   HEENT: Normocephalic, atraumatic, pupils are equal, round and reactive to light, extraocular tracking is good without limitation to gaze excursion or nystagmus noted. Hearing is grossly intact. Face is symmetric with normal facial animation. Speech is clear with no dysarthria noted. There is no hypophonia. There is no lip, neck/head, jaw or voice tremor. Neck is supple with full range of passive and active motion. There are no carotid bruits on auscultation. Oropharynx exam reveals: moderate mouth dryness, adequate dental hygiene and moderate airway crowding, due to redundant soft palate and wider uvula. Mallampati is class II. Tongue protrudes centrally and palate elevates symmetrically. Tonsils are small. Neck size is 18 inches. He has a minimal overbite.   Chest: Clear to auscultation without wheezing, rhonchi or crackles noted.  Heart: S1+S2+0, regular and normal without murmurs, rubs or gallops noted.   Abdomen: Soft, non-tender and non-distended with normal bowel sounds appreciated on auscultation.  Extremities: There is no pitting edema in the distal lower extremities bilaterally. Bony, wider ankles.  Skin: Warm and dry without trophic changes noted.   Musculoskeletal: exam reveals no obvious joint deformities, tenderness or joint swelling or erythema.   Neurologically:   Mental status: The patient is awake, alert and oriented in all 4 spheres. His immediate and remote memory, attention, language skills and fund of knowledge are appropriate. There is no  evidence of aphasia, agnosia, apraxia or anomia. Speech is clear with normal prosody and enunciation. Thought process is linear. Mood is normal and affect is normal.  Cranial nerves II - XII are as described above under HEENT exam.  Motor exam: Normal bulk, strength and tone is noted. There is no tremor, Romberg is negative. Fine motor skills and coordination: grossly intact.  Cerebellar testing: No dysmetria or intention tremor. There is no truncal or gait ataxia.  Sensory exam: intact to light touch in the upper and lower extremities.  Gait, station and balance: He stands easily. No veering to one side is noted. No leaning to one side is noted. Posture is age-appropriate and stance is narrow based. Gait shows normal stride length and normal pace. No problems turning are noted.   Assessment and Plan:  In summary, Jeovany Huitron is a very pleasant 74 y.o.-year old male with an underlying medical history of chronic kidney disease, hypertension, hyperlipidemia, elevated transaminases, bladder cancer, diabetes, neuropathy, anemia, history of COVID-19 infection in 2020, and overweight state, whose history and physical exam are concerning for obstructive sleep apnea (OSA). I had a long chat with the patient about my findings and the diagnosis of OSA, its prognosis and treatment options. We talked about medical treatments, surgical interventions and non-pharmacological approaches. I explained in particular the risks and ramifications of untreated moderate to severe OSA, especially with respect to developing cardiovascular disease down the Road, including congestive heart failure, difficult to treat hypertension, cardiac arrhythmias, or stroke. Even type 2 diabetes has, in part, been linked to untreated OSA. Symptoms of  untreated OSA include daytime sleepiness, memory problems, mood irritability and mood disorder such as depression and anxiety, lack of energy, as well as recurrent headaches, especially morning headaches. We talked about trying to maintain a healthy lifestyle in general, as well as the importance of weight control. We also talked about the importance of good sleep hygiene. I recommended the following at this time: sleep study.  He prefers a home sleep test only.  We will call him soon to arrange for testing at home. I explained the sleep test procedure to the patient and also outlined possible surgical and non-surgical treatment options of OSA.   including the use of a custom-made dental device (which would require a referral to a specialist dentist or oral surgeon), upper airway surgical options, but he is primarily interested in having a CPAP or CPAP like machine, would not prefer a dental treatment or any other treatment option, is already working on weight loss.   I answered all his questions today and the patient was in agreement. I plan to see him back after the sleep study is completed and encouraged him to call with any interim questions, concerns, problems or updates.   Thank you very much for allowing me to participate in the care of this nice patient. If I can be of any further assistance to you please do not hesitate to call me at [phone].  Sincerely,   Star Age, MD, PhD

## 2020-05-01 NOTE — Telephone Encounter (Signed)
Signed and put in box to go up front. Signed:  Crissie Sickles, MD           05/01/2020

## 2020-06-02 DIAGNOSIS — G4733 Obstructive sleep apnea (adult) (pediatric): Secondary | ICD-10-CM

## 2020-06-02 HISTORY — DX: Obstructive sleep apnea (adult) (pediatric): G47.33

## 2020-06-02 HISTORY — PX: OTHER SURGICAL HISTORY: SHX169

## 2020-06-12 ENCOUNTER — Ambulatory Visit (INDEPENDENT_AMBULATORY_CARE_PROVIDER_SITE_OTHER): Payer: Medicare Other | Admitting: Neurology

## 2020-06-12 DIAGNOSIS — G4733 Obstructive sleep apnea (adult) (pediatric): Secondary | ICD-10-CM

## 2020-06-12 DIAGNOSIS — E663 Overweight: Secondary | ICD-10-CM

## 2020-06-12 DIAGNOSIS — G479 Sleep disorder, unspecified: Secondary | ICD-10-CM

## 2020-06-12 DIAGNOSIS — G4700 Insomnia, unspecified: Secondary | ICD-10-CM

## 2020-06-12 DIAGNOSIS — R0681 Apnea, not elsewhere classified: Secondary | ICD-10-CM

## 2020-06-12 DIAGNOSIS — G4719 Other hypersomnia: Secondary | ICD-10-CM

## 2020-06-12 DIAGNOSIS — R0683 Snoring: Secondary | ICD-10-CM

## 2020-06-21 NOTE — Procedures (Signed)
Patient Information     First Name: Alexander Last Name: Hardie [name] ID: [id number]  Birth Date: 08/10/2046 Age: 74 Gender: Male  Referring Provider: Tammi Sou, MD BMI: 29.6 (W=218 lb, H=6' 0'')  Neck Circ.:  18 '' Epworth:  10/24   Sleep Study Information    Study Date: 06/12/20 S/H/A Version: 001.001.001.001 / 4.1.1528 / 36  History:    74 year old man with a history of chronic kidney disease, hypertension, hyperlipidemia, elevated transaminases, bladder cancer, diabetes, neuropathy, anemia, history of COVID-19 infection in 2020, and overweight state, who reports snoring and excessive daytime somnolence, as well as witness apneas. Summary & Diagnosis:     Severe OSA Recommendations:     This home sleep test demonstrates severe obstructive sleep apnea with a total AHI of 31.3/hour and O2 nadir of 61%. The patient will be advised to start positive airway pressure treatment at home in the form of an autoPAP titration/trial for now. He declined an attended sleep study, but a full night CPAP titration study should be considered if need be for optimization of treatment, in the future.  Please note, that untreated obstructive sleep apnea may carry additional perioperative morbidity. Patients with significant obstructive sleep apnea should receive perioperative PAP therapy and the surgeons and particularly the anesthesiologist should be informed of the diagnosis and the severity of the sleep disordered breathing. The patient should be cautioned not to drive, work at heights, or operate dangerous or heavy equipment when tired or sleepy. Review and reiteration of good sleep hygiene measures should be pursued with any patient. Other causes of the patient's symptoms, including circadian rhythm disturbances, an underlying mood disorder, medication effect and/or an underlying medical problem cannot be ruled out based on this test. Clinical correlation is recommended. The patient and his referring provider will be  notified of the test results. The patient will be seen in follow up in sleep clinic at [facility].  I certify that I have reviewed the raw data recording prior to the issuance of this report in accordance with the standards of the American Academy of Sleep Medicine (AASM).  Star Age, MD, PhD [facility]) Diplomat, ABPN (Neurology and Sleep)           Sleep Summary  Oxygen Saturation Statistics   Start Study Time: End Study Time: Total Recording Time:  9:47:14 PM 5:52:59 AM 8 h, 5 min  Total Sleep Time % REM of Sleep Time:  7 h, 21 min  25.1    Mean: 93 Minimum: 61 Maximum: 99  Mean of Desaturations Nadirs (%):   87  Oxygen Desaturation. %:   4-9 10-20 >20 Total  Events Number Total    85  66 54.5 42.3  5 3.2  156 100.0  Oxygen Saturation: <90 <=88 <85 <80 <70  Duration (minutes): Sleep % 27.4 6.2 22.0 7.0 5.0 1.6 2.1 0.5 0.3 0.1     Respiratory Indices      Total Events REM NREM All Night  pRDI:  177  pAHI:  177 ODI:  156  pAHIc:  11  % CSR: 0.0 26.8 26.8 26.8 3.4 32.3 32.3 27.8 1.7 31.3 31.3 27.6 2.0       Pulse Rate Statistics during Sleep (BPM)      Mean: 68 Minimum: 47 Maximum: 130    Indices are calculated using technically valid sleep time of 5 h, 38 min. Central-Indices are calculated using technically valid sleep time of 5 h, 34 min. pRDI/pAHI are calculated using oxi desaturations ?  3%  Body Position Statistics  Position Supine Prone Right Left Non-Supine  Sleep (min) 282.7 70.5 2.5 85.5 158.5  Sleep % 64.1 16.0 0.6 19.4 35.9  pRDI 43.7 17.1 N/A 6.3 10.1  pAHI 43.7 17.1 N/A 6.3 10.1  ODI 40.6 7.8 N/A 4.2 5.3     Snoring Statistics Snoring Level (dB) >40 >50 >60 >70 >80 >Threshold (45)  Sleep (min) 182.9 11.0 1.8 0.0 0.0 38.7  Sleep % 41.4 2.5 0.4 0.0 0.0 8.8    Mean: 41 dB Sleep Stages Chart                                                pAHI=31.3                                                             Mild              Moderate                    Severe                                                 5              15                    30

## 2020-06-21 NOTE — Addendum Note (Signed)
Addended by: Star Age on: 06/21/2020 12:40 PM   Modules accepted: Orders

## 2020-06-21 NOTE — Progress Notes (Signed)
Patient referred by Dr. Anitra Lauth, seen by me on 05/01/20, HST on 06/12/20.    Please call and notify the patient that the recent home sleep test showed obstructive sleep apnea in the severe range. He had declined a laboratory sleep study, therefore, I recommend treatment in the form of an autoPAP machine at home, through a DME company (of his choice, or as per insurance requirement). The DME representative will educate him on how to use the machine, how to put the mask on, etc. I have placed an order in the chart. Please send referral, talk to patient, send report to referring MD. We will need a FU in sleep clinic for 10 weeks post-PAP set up, please arrange that with me or one of our NPs. Thanks,   Star Age, MD, PhD [facility])

## 2020-06-25 ENCOUNTER — Encounter: Payer: Self-pay | Admitting: Family Medicine

## 2020-06-25 ENCOUNTER — Telehealth: Payer: Self-pay

## 2020-06-25 NOTE — Telephone Encounter (Signed)
-----   Message from Star Age, MD sent at 06/21/2020 12:40 PM EDT ----- Patient referred by Dr. Anitra Lauth, seen by me on 05/01/20, HST on 06/12/20.    Please call and notify the patient that the recent home sleep test showed obstructive sleep apnea in the severe range. He had declined a laboratory sleep study, therefore, I recommend treatment in the form of an autoPAP machine at home, through a DME company (of his choice, or as per insurance requirement). The DME representative will educate him on how to use the machine, how to put the mask on, etc. I have placed an order in the chart. Please send referral, talk to patient, send report to referring MD. We will need a FU in sleep clinic for 10 weeks post-PAP set up, please arrange that with me or one of our NPs. Thanks,   Star Age, MD, PhD [facility])

## 2020-06-25 NOTE — Telephone Encounter (Signed)
I called pt. I advised pt that Dr. Rexene Alberts reviewed their sleep study results and found that pt had severe osa with o2 desaturations. Dr. Rexene Alberts recommends that pt st. I reviewed PAP compliance expectations with the pt. Pt is agreeable to starting an auto-PAP for treatment at home. I advised pt that an order will be sent to a DME, [facility], and [facility] will call the pt within about one week after they file with the pt's insurance. [facility] will show the pt how to use the machine, fit for masks, and troubleshoot the auto-PAP if needed. A follow up appt was made for insurance purposes with Dr. Rexene Alberts on 08/15/2020 at 1030am. Pt verbalized understanding to arrive 15 minutes early and bring their auto-PAP. A letter with all of this information in it will be mailed to the pt as a reminder. I verified with the pt that the address we have on file is correct. Pt verbalized understanding of results. Pt had no questions at this time but was encouraged to call back if questions arise. I have sent the order to [facility] and have received confirmation that they have received the order.

## 2020-08-01 ENCOUNTER — Other Ambulatory Visit: Payer: Self-pay | Admitting: Family Medicine

## 2020-08-01 NOTE — Telephone Encounter (Signed)
Requesting: zolpidem Contract: 04/11/20 UDS:n/a Last Visit: 04/11/20 Next Visit:10/11/20 Last Refill:02/05/20(90,1)  Please Advise. Medication pending

## 2020-08-06 ENCOUNTER — Telehealth: Payer: Self-pay

## 2020-08-06 ENCOUNTER — Ambulatory Visit: Payer: Medicare Other | Admitting: Family Medicine

## 2020-08-06 NOTE — Telephone Encounter (Signed)
Patient scheduled with Dr. Raoul Pitch today at 4:00

## 2020-08-06 NOTE — Telephone Encounter (Signed)
Add on to my schedule at end of my morning today. Virtual or in person, either one. If he chooses virtual I need him to COME IN to give urine sample here and then I'll start new antibiotic.  The other option is to get VV with Dr. Maudie Mercury.  -thx

## 2020-08-06 NOTE — Telephone Encounter (Signed)
Patient has blood in urine. Patient requesting new Rx be sent to [facility]. He is taking Cipro but it isn't working

## 2020-08-06 NOTE — Telephone Encounter (Signed)
Please assist with scheudling, thanks

## 2020-08-06 NOTE — Telephone Encounter (Signed)
Please advise, thanks. Dr.Kim has openings for virtual appt. Patient has hx of frequent UTI or having UTI symptoms

## 2020-08-06 NOTE — Telephone Encounter (Signed)
Rescheduled appt with PCP McGowen tomorrow 10/6.  Patient is aware.

## 2020-08-07 ENCOUNTER — Other Ambulatory Visit: Payer: Self-pay

## 2020-08-07 ENCOUNTER — Ambulatory Visit (INDEPENDENT_AMBULATORY_CARE_PROVIDER_SITE_OTHER): Payer: Medicare Other | Admitting: Family Medicine

## 2020-08-07 ENCOUNTER — Telehealth: Payer: Self-pay

## 2020-08-07 ENCOUNTER — Encounter: Payer: Self-pay | Admitting: Family Medicine

## 2020-08-07 VITALS — BP 120/71 | HR 112 | Temp 97.7°F | Resp 16 | Wt 216.2 lb

## 2020-08-07 DIAGNOSIS — N1330 Unspecified hydronephrosis: Secondary | ICD-10-CM | POA: Diagnosis not present

## 2020-08-07 DIAGNOSIS — T83518A Infection and inflammatory reaction due to other urinary catheter, initial encounter: Secondary | ICD-10-CM | POA: Diagnosis not present

## 2020-08-07 DIAGNOSIS — N3001 Acute cystitis with hematuria: Secondary | ICD-10-CM

## 2020-08-07 DIAGNOSIS — E119 Type 2 diabetes mellitus without complications: Secondary | ICD-10-CM

## 2020-08-07 DIAGNOSIS — E1122 Type 2 diabetes mellitus with diabetic chronic kidney disease: Secondary | ICD-10-CM | POA: Diagnosis not present

## 2020-08-07 DIAGNOSIS — Z8616 Personal history of COVID-19: Secondary | ICD-10-CM | POA: Diagnosis not present

## 2020-08-07 DIAGNOSIS — N183 Chronic kidney disease, stage 3 unspecified: Secondary | ICD-10-CM | POA: Diagnosis not present

## 2020-08-07 DIAGNOSIS — N179 Acute kidney failure, unspecified: Secondary | ICD-10-CM | POA: Diagnosis not present

## 2020-08-07 DIAGNOSIS — R310 Gross hematuria: Secondary | ICD-10-CM | POA: Diagnosis not present

## 2020-08-07 DIAGNOSIS — N184 Chronic kidney disease, stage 4 (severe): Secondary | ICD-10-CM | POA: Diagnosis not present

## 2020-08-07 DIAGNOSIS — N136 Pyonephrosis: Secondary | ICD-10-CM | POA: Diagnosis not present

## 2020-08-07 DIAGNOSIS — Z8551 Personal history of malignant neoplasm of bladder: Secondary | ICD-10-CM | POA: Diagnosis not present

## 2020-08-07 LAB — CBC WITH DIFFERENTIAL/PLATELET
Basophils Absolute: 0 10*3/uL (ref 0.0–0.1)
Basophils Relative: 0.8 % (ref 0.0–3.0)
Eosinophils Absolute: 0.2 10*3/uL (ref 0.0–0.7)
Eosinophils Relative: 4 % (ref 0.0–5.0)
HCT: 35.4 % — ABNORMAL LOW (ref 39.0–52.0)
Hemoglobin: 11.1 g/dL — ABNORMAL LOW (ref 13.0–17.0)
Lymphocytes Relative: 17 % (ref 12.0–46.0)
Lymphs Abs: 0.8 10*3/uL (ref 0.7–4.0)
MCHC: 31.4 g/dL (ref 30.0–36.0)
MCV: 96.7 fl (ref 78.0–100.0)
Monocytes Absolute: 1 10*3/uL (ref 0.1–1.0)
Monocytes Relative: 19.8 % — ABNORMAL HIGH (ref 3.0–12.0)
Neutro Abs: 2.9 10*3/uL (ref 1.4–7.7)
Neutrophils Relative %: 58.4 % (ref 43.0–77.0)
Platelets: 154 10*3/uL (ref 150.0–400.0)
RBC: 3.67 Mil/uL — ABNORMAL LOW (ref 4.22–5.81)
RDW: 16.1 % — ABNORMAL HIGH (ref 11.5–15.5)
WBC: 4.9 10*3/uL (ref 4.0–10.5)

## 2020-08-07 LAB — BASIC METABOLIC PANEL
BUN: 65 mg/dL — ABNORMAL HIGH (ref 6–23)
CO2: 21 mEq/L (ref 19–32)
Calcium: 8.1 mg/dL — ABNORMAL LOW (ref 8.4–10.5)
Chloride: 109 mEq/L (ref 96–112)
Creatinine, Ser: 5.33 mg/dL (ref 0.40–1.50)
GFR: 9.71 mL/min — CL (ref >60.00)
Glucose, Bld: 323 mg/dL — ABNORMAL HIGH (ref 70–99)
Potassium: 4.3 mEq/L (ref 3.5–5.1)
Sodium: 137 mEq/L (ref 135–145)

## 2020-08-07 LAB — POCT URINALYSIS DIPSTICK
Bilirubin, UA: NEGATIVE
Glucose, UA: NEGATIVE
Ketones, UA: NEGATIVE
Nitrite, UA: NEGATIVE
Protein, UA: POSITIVE — AB
Spec Grav, UA: 1.02 (ref 1.010–1.025)
Urobilinogen, UA: 0.2 E.U./dL
pH, UA: 5.5 (ref 5.0–8.0)

## 2020-08-07 LAB — HEMOGLOBIN A1C: Hgb A1c MFr Bld: 6.9 % — ABNORMAL HIGH (ref 4.6–6.5)

## 2020-08-07 MED ORDER — CEFDINIR 300 MG PO CAPS: ORAL_CAPSULE | ORAL | 0 refills | Status: DC

## 2020-08-07 NOTE — Progress Notes (Signed)
Called patient and discussed lab results, most notably his sCr 5.3 and GFR down to 9 ml/min. I recommended he go to the emergency department immediately. He expressed understanding of the plan and agreed.  He stated he was going to [facility] ED [place].

## 2020-08-07 NOTE — Telephone Encounter (Signed)
[facility] called with critical lab.  Creatinine is 5.33 GFR 9.71

## 2020-08-07 NOTE — Progress Notes (Signed)
OFFICE VISIT  08/07/2020  CC:  Chief Complaint  Patient presents with  . Hematuria    HPI:    Patient is a 74 y.o. Caucasian male with DM, CRI III/IV who presents for "blood in urine". Onset of seeing blood in urine about 10 d/a, started cefdinir that he had left from when  I gave him some this summer-- bid x 3d and it went away for about 2d and then gross blood in urine returned.  Then he started cipro and has been taking bid x 4-5d and urine still with blood.  No urgency, no hesitancy or frequency.  No abd or flank pain.  Some mucous in urine is noted.  No blood clots.  Urine not coca cola colored---it is pinkish to red.  No fatigue or malaise.  No fevers.  He does I/o cath typically 4 times per day b/c of his urinary hx --see below.  His urine volume is normal for him/great.  Says his last UTI was about 6 mo ago, went away with abx w/out prob. His UTIs always present with gross blood. No nausea. No NSAIDs have been taken. Stools formed and brown.  Runny nose last several days, no ST/HA/cough/sob/fever.  Getting better with mucinex use.  He has eaten today but has not taken any insulin. Yesterday morning his glucose was 115.  Not tremulous, not clammy or woozy--his typical sx's he feels when his glucose is low.   Past Medical History:  Diagnosis Date  . Acute kidney injury ([place]) 08/2019   COVID 19 resp failure hospitalization  . Anemia 08/2019  . Bladder cancer ([place]) 1996   Remission  . Chronic renal insufficiency, stage III (moderate) (HCC)    Borderline stage 4 (CrCl @30 ).  Obstructive uropathy + medicorenal dz:  R>L hydroureteronephrosis 2009; no mass , just chronic poor emptying of neobladder) + diabetic nephropathy.  As of 10/2018, as renal function declined again, he refused to see a nephrologist.  . COVID-19 08/2019   tested when he had NO SYMPTOMS->tested per protocol upon admission to hosp for acute renal failure secondary to urinary retention/obstructive uropathy.  He never developed any covid 19 symptoms (wife had significant resp failure from covid, though).  . Diabetes mellitus with complication (HCC)    CXK4Y 6.5% on 02/27/10.  Hx of 384 mg on 24h urine.  DPN 2020.  . Diabetic peripheral neuropathy ([place])   . ED (erectile dysfunction)   . Elevated transaminase level 2013;2020   2013 Abd u/s nl; viral hep serologies neg.  2020->mild, noted during hospitalization for AKI on CRI (secondary to acute urinary retention).  . Hyperlipidemia    Hypertriglyceridemia (on and off meds over the years).  Statin intolerant  . Hypertension   . Insomnia   . OSA (obstructive sleep apnea) 06/2020   Home sleep study: severe OSA->CPAP recommended  . Plantar fasciitis    Steroid injection by Dr. Paulla Dolly 02/2019  . 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 CARDIOVASCULAR STRESS TEST  04/2013   Normal myocardial perfusion stress test.  . CYSTECTOMY  1997   with continent urinary reservoir (ileal loop) and artificial urinary sphincter placement-[facility]   . Polysomnogram  06/2020   Home sleep study: severe OSA->cpap recommended  . URINARY SPHINCTER REVISION     [facility] 2018    Outpatient Medications Prior to Visit  Medication Sig Dispense Refill  . Accu-Chek FastClix Lancets MISC Use to check blood sugar 2 times daily 204 each  3  . ASCORBIC ACID PO Take by mouth daily.    Marland Kitchen aspirin 81 MG tablet Take 81 mg by mouth daily.      . B Complex Vitamins (VITAMIN B COMPLEX PO) once daily.    . Coenzyme Q10 50 MG CAPS Take 150 mg by mouth daily.    . cyclobenzaprine (FLEXERIL) 10 MG tablet Take 1 tablet (10 mg total) by mouth 3 (three) times daily as needed for muscle spasms. 30 tablet 0  . GLUCOSAMINE-CHONDROITIN PO Take by mouth daily.    Marland Kitchen glucose blood (ACCU-CHEK SMARTVIEW) test strip Use to check blood sugar 2 times daily 200 each 3  . insulin NPH Human (NOVOLIN N RELION) 100 UNIT/ML injection 70 U SQ q supper 10 mL 5  . Insulin Pen  Needle 32G X 6 MM MISC Use for your pen insulin injection 4 times per day 100 each 10  . insulin regular (NOVOLIN R RELION) 100 units/mL injection 20-30 U SQ q Supper 10 mL 6  . Insulin Syringe-Needle U-100 (BD INSULIN SYRINGE ULTRAFINE) 31G X 5/16" 1 ML MISC Use as directed to administer your 70/30 insulin.  DX: 250.00 100 each prn  . losartan (COZAAR) 50 MG tablet TAKE 1 TABLET BY MOUTH DAILY. 90 tablet 3  . Omega-3 Fatty Acids (FISH OIL) 500 MG CAPS Take by mouth daily.    . vitamin E 1000 UNIT capsule Take 10,000 Units by mouth daily.    Marland Kitchen zolpidem (AMBIEN) 10 MG tablet TAKE 1 TABLET BY MOUTH AT BEDTIME AS NEEDED FOR SLEEP 90 tablet 1   No facility-administered medications prior to visit.    Allergies  Allergen Reactions  . Atorvastatin Other (See Comments)  . Gabapentin     Other reaction(s): Other (See Comments) Doesn't like side effects (prefer not to take)  . Lisinopril Cough  . Morphine     Other reaction(s): Hallucination    ROS As per HPI  PE: Vitals with BMI 08/07/2020 05/01/2020 04/11/2020  Height - 6\' 0"  6\' 1"   Weight 216 lbs 3 oz 217 lbs 220 lbs 10 oz  BMI - 36.64 40.34  Systolic 742 595 638  Diastolic 71 78 72  Pulse 756 79 65  O2 sat on RA is 98% today  Gen: Alert, well appearing.  Patient is oriented to person, place, time, and situation. AFFECT: pleasant, lucid thought and speech. EPP:IRJJ: no injection, icteris, swelling, or exudate.  EOMI, PERRLA. Mouth: lips without lesion/swelling.  Oral mucosa pink and moist. Oropharynx without erythema, exudate, or swelling.  CV: RRR (initially tachy to 115 or so but at end of visit it was down to 85-90), no m/r/g.   LUNGS: CTA bilat, nonlabored resps, good aeration in all lung fields. ABD: soft, NT/ND   LABS:    Chemistry      Component Value Date/Time   NA 136 04/11/2020 0905   NA 4 (A) 08/16/2019 0000   NA 138 08/16/2019 0000   K 4.8 04/11/2020 0905   CL 107 04/11/2020 0905   CO2 23 04/11/2020 0905   BUN  48 (H) 04/11/2020 0905   BUN 40 (A) 08/16/2019 0000   CREATININE 3.30 (H) 04/11/2020 0905   CREATININE 1.74 (H) 05/14/2011 1014      Component Value Date/Time   CALCIUM 8.9 04/11/2020 0905   ALKPHOS 82 04/11/2020 0905   AST 25 04/11/2020 0905   ALT 35 04/11/2020 0905   BILITOT 0.4 04/11/2020 0905     Lab Results  Component Value Date  HGBA1C 6.5 04/11/2020   Dipstick UA today on catheterized urine sample from his neobladder.  IMPRESSION AND PLAN:  1) Gross hematuria, Neobladder/lower urinary tract infection. Responded to cefdinir in a few days but returned. Not responding to cipro x 3-4d. Will restart him on cefdinir and have him take it minimum of 5d or until he has been 3 complete days w/out blood in urine.  UA abnl-->typical for neobladder.  Will send for c/s. CBC today.  2) CRI III/IV: I've told him in the past I'm worried about progression and asked him to see nephrologist but he declined.  He is now doing I/O cath regularly so hopefully the obstructive uropathy component is not contributing to any further loss of renal function. BMET today.  3) DM 2, on insulin.  He varies his dose quite a bit. Control has been pretty darn good historically. Hba1c today.  An After Visit Summary was printed and given to the patient.  FOLLOW UP: Return if symptoms worsen or fail to improve.  Signed:  Crissie Sickles, MD           08/07/2020

## 2020-08-08 DIAGNOSIS — Z87440 Personal history of urinary (tract) infections: Secondary | ICD-10-CM | POA: Diagnosis not present

## 2020-08-08 DIAGNOSIS — Z794 Long term (current) use of insulin: Secondary | ICD-10-CM | POA: Diagnosis not present

## 2020-08-08 DIAGNOSIS — E1122 Type 2 diabetes mellitus with diabetic chronic kidney disease: Secondary | ICD-10-CM | POA: Diagnosis present

## 2020-08-08 DIAGNOSIS — Z809 Family history of malignant neoplasm, unspecified: Secondary | ICD-10-CM | POA: Diagnosis not present

## 2020-08-08 DIAGNOSIS — N136 Pyonephrosis: Secondary | ICD-10-CM | POA: Diagnosis present

## 2020-08-08 DIAGNOSIS — Z8616 Personal history of COVID-19: Secondary | ICD-10-CM | POA: Diagnosis not present

## 2020-08-08 DIAGNOSIS — R310 Gross hematuria: Secondary | ICD-10-CM | POA: Diagnosis not present

## 2020-08-08 DIAGNOSIS — T83518A Infection and inflammatory reaction due to other urinary catheter, initial encounter: Secondary | ICD-10-CM | POA: Diagnosis present

## 2020-08-08 DIAGNOSIS — K5900 Constipation, unspecified: Secondary | ICD-10-CM | POA: Diagnosis not present

## 2020-08-08 DIAGNOSIS — Z8551 Personal history of malignant neoplasm of bladder: Secondary | ICD-10-CM | POA: Diagnosis not present

## 2020-08-08 DIAGNOSIS — B9689 Other specified bacterial agents as the cause of diseases classified elsewhere: Secondary | ICD-10-CM | POA: Diagnosis not present

## 2020-08-08 DIAGNOSIS — N1330 Unspecified hydronephrosis: Secondary | ICD-10-CM | POA: Diagnosis not present

## 2020-08-08 DIAGNOSIS — Z885 Allergy status to narcotic agent status: Secondary | ICD-10-CM | POA: Diagnosis not present

## 2020-08-08 DIAGNOSIS — N390 Urinary tract infection, site not specified: Secondary | ICD-10-CM | POA: Diagnosis not present

## 2020-08-08 DIAGNOSIS — N183 Chronic kidney disease, stage 3 unspecified: Secondary | ICD-10-CM | POA: Diagnosis not present

## 2020-08-08 DIAGNOSIS — H9190 Unspecified hearing loss, unspecified ear: Secondary | ICD-10-CM | POA: Diagnosis present

## 2020-08-08 DIAGNOSIS — E785 Hyperlipidemia, unspecified: Secondary | ICD-10-CM | POA: Diagnosis present

## 2020-08-08 DIAGNOSIS — I129 Hypertensive chronic kidney disease with stage 1 through stage 4 chronic kidney disease, or unspecified chronic kidney disease: Secondary | ICD-10-CM | POA: Diagnosis present

## 2020-08-08 DIAGNOSIS — Z841 Family history of disorders of kidney and ureter: Secondary | ICD-10-CM | POA: Diagnosis not present

## 2020-08-08 DIAGNOSIS — B9620 Unspecified Escherichia coli [E. coli] as the cause of diseases classified elsewhere: Secondary | ICD-10-CM | POA: Diagnosis present

## 2020-08-08 DIAGNOSIS — Z906 Acquired absence of other parts of urinary tract: Secondary | ICD-10-CM | POA: Diagnosis not present

## 2020-08-08 DIAGNOSIS — N184 Chronic kidney disease, stage 4 (severe): Secondary | ICD-10-CM | POA: Diagnosis present

## 2020-08-08 DIAGNOSIS — Z888 Allergy status to other drugs, medicaments and biological substances status: Secondary | ICD-10-CM | POA: Diagnosis not present

## 2020-08-08 DIAGNOSIS — Z87891 Personal history of nicotine dependence: Secondary | ICD-10-CM | POA: Diagnosis not present

## 2020-08-08 DIAGNOSIS — N179 Acute kidney failure, unspecified: Secondary | ICD-10-CM | POA: Diagnosis present

## 2020-08-08 NOTE — Telephone Encounter (Signed)
I have responded to this lab result on 08/07/20 and documented.

## 2020-08-09 DIAGNOSIS — N390 Urinary tract infection, site not specified: Secondary | ICD-10-CM | POA: Diagnosis not present

## 2020-08-09 DIAGNOSIS — N1330 Unspecified hydronephrosis: Secondary | ICD-10-CM | POA: Diagnosis not present

## 2020-08-09 DIAGNOSIS — B9620 Unspecified Escherichia coli [E. coli] as the cause of diseases classified elsewhere: Secondary | ICD-10-CM | POA: Diagnosis not present

## 2020-08-09 DIAGNOSIS — N179 Acute kidney failure, unspecified: Secondary | ICD-10-CM | POA: Diagnosis not present

## 2020-08-09 DIAGNOSIS — N183 Chronic kidney disease, stage 3 unspecified: Secondary | ICD-10-CM | POA: Diagnosis not present

## 2020-08-09 DIAGNOSIS — R310 Gross hematuria: Secondary | ICD-10-CM | POA: Diagnosis not present

## 2020-08-09 DIAGNOSIS — B9689 Other specified bacterial agents as the cause of diseases classified elsewhere: Secondary | ICD-10-CM | POA: Diagnosis not present

## 2020-08-09 LAB — URINE CULTURE
MICRO NUMBER:: 11039332
SPECIMEN QUALITY:: ADEQUATE

## 2020-08-11 DIAGNOSIS — N179 Acute kidney failure, unspecified: Secondary | ICD-10-CM | POA: Diagnosis not present

## 2020-08-11 DIAGNOSIS — N390 Urinary tract infection, site not specified: Secondary | ICD-10-CM | POA: Diagnosis not present

## 2020-08-11 DIAGNOSIS — T83591D Infection and inflammatory reaction due to implanted urinary sphincter, subsequent encounter: Secondary | ICD-10-CM | POA: Diagnosis not present

## 2020-08-12 DIAGNOSIS — N390 Urinary tract infection, site not specified: Secondary | ICD-10-CM | POA: Diagnosis not present

## 2020-08-12 DIAGNOSIS — T83591D Infection and inflammatory reaction due to implanted urinary sphincter, subsequent encounter: Secondary | ICD-10-CM | POA: Diagnosis not present

## 2020-08-12 DIAGNOSIS — N179 Acute kidney failure, unspecified: Secondary | ICD-10-CM | POA: Diagnosis not present

## 2020-08-13 DIAGNOSIS — T83591D Infection and inflammatory reaction due to implanted urinary sphincter, subsequent encounter: Secondary | ICD-10-CM | POA: Diagnosis not present

## 2020-08-13 DIAGNOSIS — N390 Urinary tract infection, site not specified: Secondary | ICD-10-CM | POA: Diagnosis not present

## 2020-08-13 DIAGNOSIS — N179 Acute kidney failure, unspecified: Secondary | ICD-10-CM | POA: Diagnosis not present

## 2020-08-14 DIAGNOSIS — N390 Urinary tract infection, site not specified: Secondary | ICD-10-CM | POA: Diagnosis not present

## 2020-08-14 DIAGNOSIS — T83591D Infection and inflammatory reaction due to implanted urinary sphincter, subsequent encounter: Secondary | ICD-10-CM | POA: Diagnosis not present

## 2020-08-14 DIAGNOSIS — N179 Acute kidney failure, unspecified: Secondary | ICD-10-CM | POA: Diagnosis not present

## 2020-08-15 ENCOUNTER — Ambulatory Visit: Payer: Self-pay | Admitting: Neurology

## 2020-08-16 DIAGNOSIS — N179 Acute kidney failure, unspecified: Secondary | ICD-10-CM | POA: Diagnosis not present

## 2020-08-16 DIAGNOSIS — N390 Urinary tract infection, site not specified: Secondary | ICD-10-CM | POA: Diagnosis not present

## 2020-08-16 DIAGNOSIS — T83591D Infection and inflammatory reaction due to implanted urinary sphincter, subsequent encounter: Secondary | ICD-10-CM | POA: Diagnosis not present

## 2020-08-19 ENCOUNTER — Ambulatory Visit: Payer: Medicare Other | Admitting: Family Medicine

## 2020-08-20 DIAGNOSIS — N390 Urinary tract infection, site not specified: Secondary | ICD-10-CM | POA: Diagnosis not present

## 2020-08-20 DIAGNOSIS — N179 Acute kidney failure, unspecified: Secondary | ICD-10-CM | POA: Diagnosis not present

## 2020-08-20 DIAGNOSIS — T83591D Infection and inflammatory reaction due to implanted urinary sphincter, subsequent encounter: Secondary | ICD-10-CM | POA: Diagnosis not present

## 2020-09-11 DIAGNOSIS — N179 Acute kidney failure, unspecified: Secondary | ICD-10-CM | POA: Diagnosis not present

## 2020-09-11 DIAGNOSIS — N183 Chronic kidney disease, stage 3 unspecified: Secondary | ICD-10-CM | POA: Diagnosis not present

## 2020-09-11 DIAGNOSIS — R339 Retention of urine, unspecified: Secondary | ICD-10-CM | POA: Diagnosis not present

## 2020-10-09 DIAGNOSIS — N1831 Chronic kidney disease, stage 3a: Secondary | ICD-10-CM | POA: Diagnosis not present

## 2020-10-09 DIAGNOSIS — N179 Acute kidney failure, unspecified: Secondary | ICD-10-CM | POA: Diagnosis not present

## 2020-10-11 ENCOUNTER — Ambulatory Visit (INDEPENDENT_AMBULATORY_CARE_PROVIDER_SITE_OTHER): Payer: Medicare Other | Admitting: Family Medicine

## 2020-10-11 ENCOUNTER — Encounter: Payer: Self-pay | Admitting: Family Medicine

## 2020-10-11 ENCOUNTER — Other Ambulatory Visit: Payer: Self-pay

## 2020-10-11 VITALS — BP 107/64 | HR 55 | Temp 98.1°F | Resp 16 | Ht 72.0 in | Wt 198.8 lb

## 2020-10-11 DIAGNOSIS — I10 Essential (primary) hypertension: Secondary | ICD-10-CM

## 2020-10-11 DIAGNOSIS — E119 Type 2 diabetes mellitus without complications: Secondary | ICD-10-CM

## 2020-10-11 DIAGNOSIS — E7800 Pure hypercholesterolemia, unspecified: Secondary | ICD-10-CM

## 2020-10-11 DIAGNOSIS — Z23 Encounter for immunization: Secondary | ICD-10-CM | POA: Diagnosis not present

## 2020-10-11 DIAGNOSIS — N139 Obstructive and reflux uropathy, unspecified: Secondary | ICD-10-CM | POA: Diagnosis not present

## 2020-10-11 DIAGNOSIS — N184 Chronic kidney disease, stage 4 (severe): Secondary | ICD-10-CM

## 2020-10-11 DIAGNOSIS — N390 Urinary tract infection, site not specified: Secondary | ICD-10-CM | POA: Diagnosis not present

## 2020-10-11 NOTE — Patient Instructions (Signed)
Stop losartan

## 2020-10-11 NOTE — Progress Notes (Signed)
OFFICE VISIT  10/11/2020  CC:  Chief Complaint  Patient presents with  . Follow-up    RCI, pt is not fasting    HPI:    Patient is a 74 y.o. Caucasian male who presents for f/u DM, CRI, HLD. I last saw him about 2 mo ago. A/P as of that visit: ") Gross hematuria, Neobladder/lower urinary tract infection. Responded to cefdinir in a few days but returned. Not responding to cipro x 3-4d. Will restart him on cefdinir and have him take it minimum of 5d or until he has been 3 complete days w/out blood in urine.  UA abnl-->typical for neobladder.  Will send for c/s. CBC today.  2) CRI III/IV: I've told him in the past I'm worried about progression and asked him to see nephrologist but he declined.  He is now doing I/O cath regularly so hopefully the obstructive uropathy component is not contributing to any further loss of renal function. BMET today.  3) DM 2, on insulin.  He varies his dose quite a bit. Control has been pretty darn good historically. Hba1c today."  INTERIM HX: Acute-on-chronic renal failure on labs last visit->GFR 9 ml/min/sCr 5.3->sent him to ED. Hospitalized, abx for UTI, imaging showed obst of ureter at neobladder anastamosis, bilat nephrost tubes inserted, is in the process of getting ongoing eval to see if obstruction persisting or not, question ultimately may need surgery to clear it.  Has plan for nephrostogram at next tube exchange on 12/29 to determine if any downstream obst persisting.  He's on methanamine and vit C for UTI suppression, urol recommended no abx or urine screening be done.  CURRENTLY: He's feeling well, is happy on "optivia" wt loss/diet program. DM: a1c on 10/6 was 6.9%.  Not taking any insulin for the most part, occ uses it when gluc goes up from dietary indescretion. Says glucs 130s usually, no matter what time of day, etc. Feet: no burning, tingling, or numbness.  HTN: taking losartan 50mg  qd.  No home bp monitoring.  HLD: statin  intol.   ROS: no fevers, no CP, no SOB, no wheezing, no cough, no dizziness, no HAs, no rashes, no melena/hematochezia.  No polyuria or polydipsia.  No myalgias or arthralgias.  No focal weakness, paresthesias, or tremors.  No acute vision or hearing abnormalities. No n/v/d or abd pain.  No palpitations.    Past Medical History:  Diagnosis Date  . Acute kidney injury ([place]) 08/2019   COVID 19 resp failure hospitalization  . Anemia 08/2019  . Bladder cancer ([place]) 1996   Remission  . Chronic renal insufficiency, stage III (moderate) (HCC)    Borderline stage 4 (CrCl @30 ).  Obstructive uropathy + medicorenal dz:  R>L hydroureteronephrosis 2009; no mass , just chronic poor emptying of neobladder) + diabetic nephropathy.  As of 10/2018, as renal function declined again, he refused to see a nephrologist.  . COVID-19 08/2019   tested when he had NO SYMPTOMS->tested per protocol upon admission to hosp for acute renal failure secondary to urinary retention/obstructive uropathy. He never developed any covid 19 symptoms (wife had significant resp failure from covid, though).  . Diabetes mellitus with complication (HCC)    [id number] 6.5% on 02/27/10.  Hx of 384 mg on 24h urine.  DPN 2020.  . Diabetic peripheral neuropathy ([place])   . ED (erectile dysfunction)   . Elevated transaminase level 2013;2020   2013 Abd u/s nl; viral hep serologies neg.  2020->mild, noted during hospitalization for AKI on CRI (secondary  to acute urinary retention).  . Hyperlipidemia    Hypertriglyceridemia (on and off meds over the years).  Statin intolerant  . Hypertension   . Insomnia   . OSA (obstructive sleep apnea) 06/2020   Home sleep study: severe OSA->CPAP recommended  . Plantar fasciitis    Steroid injection by Dr. Paulla Dolly 02/2019  . 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 CARDIOVASCULAR STRESS TEST  04/2013   Normal myocardial perfusion stress test.  . CYSTECTOMY  1997   with  continent urinary reservoir (ileal loop) and artificial urinary sphincter placement-[facility]   . Polysomnogram  06/2020   Home sleep study: severe OSA->cpap recommended  . URINARY SPHINCTER REVISION     [facility] 2018    Outpatient Medications Prior to Visit  Medication Sig Dispense Refill  . Accu-Chek FastClix Lancets MISC Use to check blood sugar 2 times daily 204 each 3  . ASCORBIC ACID PO Take by mouth daily.    Marland Kitchen aspirin 81 MG tablet Take 81 mg by mouth daily.    . B Complex Vitamins (VITAMIN B COMPLEX PO) once daily.    . Coenzyme Q10 50 MG CAPS Take 150 mg by mouth daily.    . cyclobenzaprine (FLEXERIL) 10 MG tablet Take 1 tablet (10 mg total) by mouth 3 (three) times daily as needed for muscle spasms. 30 tablet 0  . GLUCOSAMINE-CHONDROITIN PO Take by mouth daily.    Marland Kitchen glucose blood (ACCU-CHEK SMARTVIEW) test strip Use to check blood sugar 2 times daily 200 each 3  . Omega-3 Fatty Acids (FISH OIL) 500 MG CAPS Take by mouth daily.    . vitamin E 1000 UNIT capsule Take 10,000 Units by mouth daily.    Marland Kitchen zolpidem (AMBIEN) 10 MG tablet TAKE 1 TABLET BY MOUTH AT BEDTIME AS NEEDED FOR SLEEP 90 tablet 1  . cefdinir (OMNICEF) 300 MG capsule 1 tab po bid x 10d 20 capsule 0  . losartan (COZAAR) 50 MG tablet TAKE 1 TABLET BY MOUTH DAILY. 90 tablet 3  . insulin NPH Human (NOVOLIN N RELION) 100 UNIT/ML injection 70 U SQ q supper (Patient not taking: Reported on 10/11/2020) 10 mL 5  . Insulin Pen Needle 32G X 6 MM MISC Use for your pen insulin injection 4 times per day (Patient not taking: Reported on 10/11/2020) 100 each 10  . insulin regular (NOVOLIN R RELION) 100 units/mL injection 20-30 U SQ q Supper (Patient not taking: Reported on 10/11/2020) 10 mL 6  . Insulin Syringe-Needle U-100 (BD INSULIN SYRINGE ULTRAFINE) 31G X 5/16" 1 ML MISC Use as directed to administer your 70/30 insulin.  DX: 250.00 (Patient not taking: Reported on 10/11/2020) 100 each prn   No facility-administered medications prior to  visit.    Allergies  Allergen Reactions  . Atorvastatin Other (See Comments)  . Gabapentin     Other reaction(s): Other (See Comments) Doesn't like side effects (prefer not to take)  . Lisinopril Cough  . Morphine     Other reaction(s): Hallucination    ROS As per HPI  PE: Vitals with BMI 10/11/2020 08/07/2020 05/01/2020  Height 6\' 0"  - 6\' 0"   Weight 198 lbs 13 oz 216 lbs 3 oz 217 lbs  BMI 63.14 - 97.02  Systolic 637 858 850  Diastolic 64 71 78  Pulse 55 112 79     Gen: Alert, well appearing.  Patient is oriented to person, place, time, and situation. AFFECT: pleasant, lucid thought and speech.  CV: RRR, no m/r/g.   LUNGS: CTA bilat, nonlabored resps, good aeration in all lung fields. EXT: no clubbing or cyanosis.  no edema.  Foot exam - no swelling, tenderness or skin or vascular lesions. Color and temperature is normal. Sensation is intact. Peripheral pulses are palpable. Toenails are normal.  LABS:  Lab Results  Component Value Date   TSH 0.63 08/24/2011   Lab Results  Component Value Date   WBC 4.9 08/07/2020   HGB 11.1 (L) 08/07/2020   HCT 35.4 (L) 08/07/2020   MCV 96.7 08/07/2020   PLT 154.0 08/07/2020   Lab Results  Component Value Date   CREATININE 5.33 (HH) 08/07/2020   BUN 65 (H) 08/07/2020   NA 137 08/07/2020   K 4.3 08/07/2020   CL 109 08/07/2020   CO2 21 08/07/2020   Lab Results  Component Value Date   ALT 35 04/11/2020   AST 25 04/11/2020   ALKPHOS 82 04/11/2020   BILITOT 0.4 04/11/2020   Lab Results  Component Value Date   CHOL 143 04/11/2020   Lab Results  Component Value Date   HDL 30.10 (L) 04/11/2020   Lab Results  Component Value Date   LDLCALC 93 04/11/2020   Lab Results  Component Value Date   TRIG 99.0 04/11/2020   Lab Results  Component Value Date   CHOLHDL 5 04/11/2020   Lab Results  Component Value Date   HGBA1C 6.9 (H) 08/07/2020   10/09/20 labs at urologist reviewed today: K 5.1, BUN 69, sCr 3.44, GFR 17  ml/min.  IMPRESSION AND PLAN:  Doing well!  1) DM 2.   Great diet and activity. Home gluc's pretty good OFF insulin for the most part. Next a1c to be done in 1 mo--ordered future. Feet exam normal today.  2) HTN:  D/c losartan--(bp consistently low-normal here, don't need to risk impairing GFR, K mildly high as well).  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 Doing well with bilat nephrostomy tubes, GFR has improved a little since getting these. Plan is to cont exchanging these, with next interchange to include nephrostogram to check for any ongoing downstream obstruction.  Followed closely by urology (Dr. Amalia Hailey, [facility]).  Labs, none today: see [facility] labs from 12/8 in labs section above. next a1c repeat due after 11/07/20. FLu->pt declined. Covid 19->pt declined. Prevnar 13 given today.  An After Visit Summary was printed and given to the patient.  FOLLOW UP: Return in about 3 months (around 01/09/2021) for routine chronic illness f/u, plus 1 mo non-fasting lab visit for a1c.  Signed:  Crissie Sickles, MD           10/11/2020

## 2020-10-29 DIAGNOSIS — N1330 Unspecified hydronephrosis: Secondary | ICD-10-CM | POA: Diagnosis not present

## 2020-10-29 DIAGNOSIS — N179 Acute kidney failure, unspecified: Secondary | ICD-10-CM | POA: Diagnosis not present

## 2020-10-29 DIAGNOSIS — N183 Chronic kidney disease, stage 3 unspecified: Secondary | ICD-10-CM | POA: Diagnosis not present

## 2020-10-29 DIAGNOSIS — Z436 Encounter for attention to other artificial openings of urinary tract: Secondary | ICD-10-CM | POA: Diagnosis not present

## 2020-11-13 DIAGNOSIS — N1330 Unspecified hydronephrosis: Secondary | ICD-10-CM | POA: Diagnosis not present

## 2020-11-13 DIAGNOSIS — N183 Chronic kidney disease, stage 3 unspecified: Secondary | ICD-10-CM | POA: Diagnosis not present

## 2020-11-13 DIAGNOSIS — R339 Retention of urine, unspecified: Secondary | ICD-10-CM | POA: Diagnosis not present

## 2020-11-21 ENCOUNTER — Telehealth: Payer: Self-pay

## 2020-11-21 ENCOUNTER — Encounter: Payer: Self-pay | Admitting: Family Medicine

## 2020-11-21 DIAGNOSIS — N185 Chronic kidney disease, stage 5: Secondary | ICD-10-CM

## 2020-11-21 DIAGNOSIS — N139 Obstructive and reflux uropathy, unspecified: Secondary | ICD-10-CM

## 2020-11-21 NOTE — Telephone Encounter (Signed)
Patient was scheduled tomorrow morning for labs. I contacted patient because our office is going to have a delayed opening due to potential icy conditions on roads.  Patient was okay to be rescheduled. His lab appt is now on 11/26/20 - non fasting lab visit to check A1C. Patient asked if we could check kidney functions also.  No order on file from Dr. Anitra Lauth.  Please advise.  Patient does not need to be called back IF kidney function labs can be added to his visit.   Thank you! Hinton Dyer

## 2020-11-21 NOTE — Telephone Encounter (Signed)
Please advise. Last drawn on 08/2020

## 2020-11-21 NOTE — Telephone Encounter (Signed)
Yes BMET ok. Future order placed.

## 2020-11-22 ENCOUNTER — Ambulatory Visit: Payer: Medicare Other

## 2020-11-22 NOTE — Telephone Encounter (Signed)
Noted  

## 2020-11-26 ENCOUNTER — Other Ambulatory Visit: Payer: Self-pay

## 2020-11-26 ENCOUNTER — Ambulatory Visit (INDEPENDENT_AMBULATORY_CARE_PROVIDER_SITE_OTHER): Payer: Medicare Other

## 2020-11-26 ENCOUNTER — Telehealth: Payer: Self-pay

## 2020-11-26 DIAGNOSIS — N185 Chronic kidney disease, stage 5: Secondary | ICD-10-CM | POA: Diagnosis not present

## 2020-11-26 DIAGNOSIS — E119 Type 2 diabetes mellitus without complications: Secondary | ICD-10-CM | POA: Diagnosis not present

## 2020-11-26 DIAGNOSIS — N139 Obstructive and reflux uropathy, unspecified: Secondary | ICD-10-CM | POA: Diagnosis not present

## 2020-11-26 LAB — BASIC METABOLIC PANEL
BUN: 67 mg/dL — ABNORMAL HIGH (ref 6–23)
CO2: 24 mEq/L (ref 19–32)
Calcium: 8.9 mg/dL (ref 8.4–10.5)
Chloride: 106 mEq/L (ref 96–112)
Creatinine, Ser: 3.96 mg/dL — ABNORMAL HIGH (ref 0.40–1.50)
GFR: 14.16 mL/min — CL (ref >60.00)
Glucose, Bld: 192 mg/dL — ABNORMAL HIGH (ref 70–99)
Potassium: 4.9 mEq/L (ref 3.5–5.1)
Sodium: 137 mEq/L (ref 135–145)

## 2020-11-26 LAB — HEMOGLOBIN A1C: Hgb A1c MFr Bld: 5.9 % (ref 4.6–6.5)

## 2020-11-26 NOTE — Telephone Encounter (Signed)
CRITICAL VALUE STICKER  CRITICAL VALUE: GFR 14.16  RECEIVER (on-site recipient of call): Lorriane Shire  DATE & TIME NOTIFIED: 1632  MESSENGER (representative from lab): Okey Dupre  MD NOTIFIED: [place]  TIME OF NOTIFICATION: 8099 (Teams)  RESPONSE:

## 2020-11-26 NOTE — Telephone Encounter (Signed)
Noted. Addressed in result note.

## 2020-11-27 ENCOUNTER — Other Ambulatory Visit: Payer: Self-pay | Admitting: Family Medicine

## 2020-11-27 DIAGNOSIS — N185 Chronic kidney disease, stage 5: Secondary | ICD-10-CM

## 2020-11-27 DIAGNOSIS — N139 Obstructive and reflux uropathy, unspecified: Secondary | ICD-10-CM

## 2020-12-20 DIAGNOSIS — N135 Crossing vessel and stricture of ureter without hydronephrosis: Secondary | ICD-10-CM | POA: Diagnosis not present

## 2020-12-20 DIAGNOSIS — Z886 Allergy status to analgesic agent status: Secondary | ICD-10-CM | POA: Diagnosis not present

## 2020-12-20 DIAGNOSIS — Z906 Acquired absence of other parts of urinary tract: Secondary | ICD-10-CM | POA: Diagnosis not present

## 2020-12-20 DIAGNOSIS — Z888 Allergy status to other drugs, medicaments and biological substances status: Secondary | ICD-10-CM | POA: Diagnosis not present

## 2020-12-20 DIAGNOSIS — Z436 Encounter for attention to other artificial openings of urinary tract: Secondary | ICD-10-CM | POA: Diagnosis not present

## 2020-12-20 DIAGNOSIS — T83022A Displacement of nephrostomy catheter, initial encounter: Secondary | ICD-10-CM | POA: Diagnosis not present

## 2020-12-20 DIAGNOSIS — Z20822 Contact with and (suspected) exposure to covid-19: Secondary | ICD-10-CM | POA: Diagnosis not present

## 2020-12-21 DIAGNOSIS — N135 Crossing vessel and stricture of ureter without hydronephrosis: Secondary | ICD-10-CM | POA: Diagnosis not present

## 2020-12-21 DIAGNOSIS — Z436 Encounter for attention to other artificial openings of urinary tract: Secondary | ICD-10-CM | POA: Diagnosis not present

## 2020-12-21 DIAGNOSIS — Z906 Acquired absence of other parts of urinary tract: Secondary | ICD-10-CM | POA: Diagnosis not present

## 2020-12-30 ENCOUNTER — Telehealth: Payer: Self-pay

## 2020-12-30 NOTE — Telephone Encounter (Signed)
Patient calling to check status of referral to [place] [facility].   Per referral notes, referral documents was faxed on 11/27/20.  Please check status.  Patient can be reached at [phone].

## 2021-01-03 DIAGNOSIS — N1330 Unspecified hydronephrosis: Secondary | ICD-10-CM | POA: Diagnosis not present

## 2021-01-03 DIAGNOSIS — Z794 Long term (current) use of insulin: Secondary | ICD-10-CM | POA: Diagnosis not present

## 2021-01-03 DIAGNOSIS — Z436 Encounter for attention to other artificial openings of urinary tract: Secondary | ICD-10-CM | POA: Diagnosis not present

## 2021-01-03 DIAGNOSIS — Z8551 Personal history of malignant neoplasm of bladder: Secondary | ICD-10-CM | POA: Diagnosis not present

## 2021-01-03 DIAGNOSIS — E1122 Type 2 diabetes mellitus with diabetic chronic kidney disease: Secondary | ICD-10-CM | POA: Diagnosis not present

## 2021-01-03 DIAGNOSIS — N189 Chronic kidney disease, unspecified: Secondary | ICD-10-CM | POA: Diagnosis not present

## 2021-01-07 ENCOUNTER — Other Ambulatory Visit: Payer: Self-pay

## 2021-01-09 ENCOUNTER — Telehealth: Payer: Self-pay

## 2021-01-09 ENCOUNTER — Other Ambulatory Visit: Payer: Self-pay

## 2021-01-09 ENCOUNTER — Encounter: Payer: Self-pay | Admitting: Family Medicine

## 2021-01-09 ENCOUNTER — Telehealth: Payer: Self-pay | Admitting: Family Medicine

## 2021-01-09 ENCOUNTER — Ambulatory Visit (INDEPENDENT_AMBULATORY_CARE_PROVIDER_SITE_OTHER): Payer: Medicare Other | Admitting: Family Medicine

## 2021-01-09 VITALS — BP 125/77 | HR 85 | Temp 98.0°F | Resp 16 | Ht 72.0 in | Wt 192.0 lb

## 2021-01-09 DIAGNOSIS — E1149 Type 2 diabetes mellitus with other diabetic neurological complication: Secondary | ICD-10-CM

## 2021-01-09 DIAGNOSIS — N185 Chronic kidney disease, stage 5: Secondary | ICD-10-CM

## 2021-01-09 DIAGNOSIS — F5101 Primary insomnia: Secondary | ICD-10-CM | POA: Diagnosis not present

## 2021-01-09 LAB — BASIC METABOLIC PANEL
BUN: 61 mg/dL — ABNORMAL HIGH (ref 6–23)
CO2: 23 mEq/L (ref 19–32)
Calcium: 8.1 mg/dL — ABNORMAL LOW (ref 8.4–10.5)
Chloride: 106 mEq/L (ref 96–112)
Creatinine, Ser: 4.19 mg/dL — ABNORMAL HIGH (ref 0.40–1.50)
GFR: 13.22 mL/min — CL (ref >60.00)
Glucose, Bld: 262 mg/dL — ABNORMAL HIGH (ref 70–99)
Potassium: 4.2 mEq/L (ref 3.5–5.1)
Sodium: 136 mEq/L (ref 135–145)

## 2021-01-09 NOTE — Telephone Encounter (Signed)
CRITICAL VALUE STICKER  CRITICAL VALUE: GFR 13.22  RECEIVER (on-site recipient of call): Lorriane Shire  DATE & TIME NOTIFIED: 01/09/21 1614  MESSENGER (representative from lab):Hope  MD NOTIFIED: [place]en  TIME OF NOTIFICATION: 9167  RESPONSE:

## 2021-01-09 NOTE — Telephone Encounter (Signed)
Notified PCP

## 2021-01-09 NOTE — Progress Notes (Signed)
OFFICE VISIT  01/09/2021  CC:  Chief Complaint  Patient presents with  . Follow-up    RCI, 3 mo. Pt is    HPI:    Patient is a 75 y.o. Caucasian male who presents for 3 mo f/u DM, CRI IV/V, and insomnia. A/P as of last visit: "1) DM 2.   Great diet and activity. Home gluc's pretty good OFF insulin for the most part. Next a1c to be done in 1 mo--ordered future. Feet exam normal today.  2) HTN:  D/c losartan--(bp consistently low-normal here, don't need to risk impairing GFR, K mildly high as well).  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 Doing well with bilat nephrostomy tubes, GFR has improved a little since getting these. Plan is to cont exchanging these, with next interchange to include nephrostogram to check for any ongoing downstream obstruction.  Followed closely by urology (Dr. Amalia Hailey, [facility]).  Labs, none today: see [facility] labs from 12/8 in labs section above. next a1c repeat due after 11/07/20. FLu->pt declined. Covid 19->pt declined. Prevnar 13 given today.  INTERIM HX: Feeling pretty good, just some fatigue--"some good days and some bad days". Eating and drinking okay. Just spent 2 weeks in [place] last month.  Says "I know my sugars are normal".  Has not taken any insulin in months.  Some more urine obstruction probs, ongoing f/u with urol, has bilat PCN tubes but there was recent trial of bilat JJ stents b/c pt tired of the tubes exiting externally (one got caught on a bathroom doornob rcently and was ripped out)--the kidneys did not drain as hoped, though, so bilat PCN tubes put back in.  He does still do I/o cath every morning and every night before bed and says he gets "a little".  He gets 400-500 ml 3-4 times per day bilat from pcn sites.   Uses ambien nearly every night.  It helps him initiate sleep and maintains it for 5 hrs or so. PMP AWARE reviewed today: most recent rx for ambien was filled 11/15/20,  # 26, rx by me. No red flags.  He tried CPAP for 3 wks but couldn't tolerate it, unfortunately.    Past Medical History:  Diagnosis Date  . Acute kidney injury ([place]) 08/2019   COVID 19 resp failure hospitalization  . Anemia 08/2019  . Bladder cancer [facility]) 1996   radical cystoprostatectomy  . Chronic renal insufficiency, stage 5 (HCC)    Borderline stage 4 ([facility] @30 ) until 2021 (Obstructive uropathy + medicorenal dz:  R>L hydroureteronephrosis 2009; no mass , just chronic poor emptying of neobladder) + diabetic nephropathy.10/2018 renal function declined again, he refused to see a neph. 2021 GFR drop to 5-78ml/min, found to have bilat uret obst, bilat PCN tubes placed, f/u GFR 11/13/20 at urol 49ml/min, K 5.3.   . COVID-19 08/2019   tested when he had NO SYMPTOMS->tested per protocol upon admission to hosp for acute renal failure secondary to urinary retention/obstructive uropathy. He never developed any covid 19 symptoms (wife had significant resp failure from covid, though).  . Diabetes mellitus with complication (HCC)    YSA6T 6.5% on 02/27/10.  Hx of 384 mg on 24h urine.  DPN 2020.  . Diabetic peripheral neuropathy ([place])   . ED (erectile dysfunction)   . Elevated transaminase level 2013;2020   2013 Abd u/s nl; viral hep serologies neg.  2020->mild, noted during hospitalization for AKI on CRI (secondary to acute urinary retention).  . Hyperlipidemia  Hypertriglyceridemia (on and off meds over the years).  Statin intolerant  . Hypertension   . Insomnia   . OSA (obstructive sleep apnea) 06/2020   Home sleep study: severe OSA->CPAP recommended  . Plantar fasciitis    Steroid injection by Dr. Paulla Dolly 02/2019  . 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 CARDIOVASCULAR STRESS TEST  04/2013   Normal myocardial perfusion stress test.  . CYSTECTOMY  1997   with continent urinary reservoir (ileal loop) and artificial urinary sphincter placement-[facility]    . Polysomnogram  06/2020   Home sleep study: severe OSA->cpap recommended  . URINARY SPHINCTER REVISION     [facility] 2018    Outpatient Medications Prior to Visit  Medication Sig Dispense Refill  . aspirin 81 MG tablet Take 81 mg by mouth daily.    Marland Kitchen zolpidem (AMBIEN) 10 MG tablet TAKE 1 TABLET BY MOUTH AT BEDTIME AS NEEDED FOR SLEEP 90 tablet 1  . B Complex Vitamins (VITAMIN B COMPLEX PO) once daily.    . Coenzyme Q10 50 MG CAPS Take 150 mg by mouth daily.    Marland Kitchen GLUCOSAMINE-CHONDROITIN PO Take by mouth daily.    Marland Kitchen glucose blood (ACCU-CHEK SMARTVIEW) test strip Use to check blood sugar 2 times daily 200 each 3  . vitamin E 1000 UNIT capsule Take 10,000 Units by mouth daily.    . Insulin Pen Needle 32G X 6 MM MISC Use for your pen insulin injection 4 times per day (Patient not taking: No sig reported) 100 each 10  . insulin regular (NOVOLIN R RELION) 100 units/mL injection 20-30 U SQ q Supper (Patient not taking: No sig reported) 10 mL 6  . Insulin Syringe-Needle U-100 (BD INSULIN SYRINGE ULTRAFINE) 31G X 5/16" 1 ML MISC Use as directed to administer your 70/30 insulin.  DX: 250.00 (Patient not taking: No sig reported) 100 each prn   No facility-administered medications prior to visit.    Allergies  Allergen Reactions  . Atorvastatin Other (See Comments)  . Gabapentin     Other reaction(s): Other (See Comments) Doesn't like side effects (prefer not to take)  . Lisinopril Cough  . Morphine     Other reaction(s): Hallucination    ROS As per HPI  PE: Vitals with BMI 01/09/2021 10/11/2020 08/07/2020  Height 6\' 0"  6\' 0"  -  Weight 192 lbs 198 lbs 13 oz 216 lbs 3 oz  BMI 37.16 96.78 -  Systolic 938 101 751  Diastolic 77 64 71  Pulse 85 55 112    Gen: Alert, a bit tired but otherwise well- appearing.  Patient is oriented to person, place, time, and situation. AFFECT: pleasant, lucid thought and speech. CV: RRR, no m/r/g.   LUNGS: CTA bilat, nonlabored resps, good aeration in all  lung fields. EXT: no clubbing or cyanosis.  no edema.    LABS:  Lab Results  Component Value Date   TSH 0.63 08/24/2011   Lab Results  Component Value Date   WBC 4.9 08/07/2020   HGB 11.1 (L) 08/07/2020   HCT 35.4 (L) 08/07/2020   MCV 96.7 08/07/2020   PLT 154.0 08/07/2020   Lab Results  Component Value Date   IRON 150 10/20/2019   TIBC 313 10/20/2019   FERRITIN 198 10/20/2019   Lab Results  Component Value Date   CREATININE 3.96 (H) 11/26/2020   BUN 67 (H) 11/26/2020   NA 137 11/26/2020   K 4.9 11/26/2020   CL 106 11/26/2020  CO2 24 11/26/2020   Lab Results  Component Value Date   ALT 35 04/11/2020   AST 25 04/11/2020   ALKPHOS 82 04/11/2020   BILITOT 0.4 04/11/2020   Lab Results  Component Value Date   CHOL 143 04/11/2020   Lab Results  Component Value Date   HDL 30.10 (L) 04/11/2020   Lab Results  Component Value Date   LDLCALC 93 04/11/2020   Lab Results  Component Value Date   TRIG 99.0 04/11/2020   Lab Results  Component Value Date   CHOLHDL 5 04/11/2020   Lab Results  Component Value Date   HGBA1C 5.9 11/26/2020   IMPRESSION AND PLAN:  1) DM 2: control has fluctuated signif over the years, most recently he has seemed to be in a holding pattern of good control with no need for insulin--although home glucose monitoring is spotty at best. Hba1c last done 6 wks ago (5.9%) so next check will be due when I see him for f/u in 3 mo.  2) CRI stage V: GFR 14 about 6 wks ago. Obstructive uropathy primary cause, likely some medical renal dz contributing, though.  Nephrol referral in the works. BMET today. Cont with urol f/u, PCN drainage, I/o cath bid as currently doing.  3) Insomnia: doing well long term on ambien. No new rx for this med needed today.  Spent 30 min with pt today reviewing HPI, reviewing relevant past history, doing exam, reviewing and discussing lab and imaging data, and formulating plans.  An After Visit Summary was printed  and given to the patient.  FOLLOW UP: Return in about 3 months (around 04/11/2021) for routine chronic illness f/u.  Signed:  Crissie Sickles, MD           01/09/2021

## 2021-01-09 NOTE — Telephone Encounter (Signed)
Patient has not been contacted by [facility] in [facility]. I have also been unable to reach them several times. Can his referral go to [facility] in [facility] or [facility]) in [place]?

## 2021-01-10 ENCOUNTER — Other Ambulatory Visit (INDEPENDENT_AMBULATORY_CARE_PROVIDER_SITE_OTHER): Payer: Medicare Other

## 2021-01-10 LAB — ALBUMIN: Albumin: 3.2 g/dL — ABNORMAL LOW (ref 3.5–5.2)

## 2021-01-10 LAB — PHOSPHORUS: Phosphorus: 3.9 mg/dL (ref 2.3–4.6)

## 2021-01-10 NOTE — Telephone Encounter (Signed)
Noted. See result note attached to remainder of labs from 01/09/21. Signed:  Crissie Sickles, MD           01/10/2021

## 2021-02-05 ENCOUNTER — Telehealth: Payer: Self-pay | Admitting: Family Medicine

## 2021-02-05 NOTE — Telephone Encounter (Signed)
FYI. Please see below. O/v notes requested

## 2021-02-05 NOTE — Telephone Encounter (Signed)
Can you check with pt and see the status of nephrologist referral. Last order was 01/09/21 for [facility]. -thx

## 2021-02-05 NOTE — Telephone Encounter (Signed)
The patient had an initial appointment with them on 01/17/21.

## 2021-02-05 NOTE — Telephone Encounter (Signed)
Ok thank you 

## 2021-03-03 DIAGNOSIS — N1330 Unspecified hydronephrosis: Secondary | ICD-10-CM | POA: Diagnosis not present

## 2021-03-04 ENCOUNTER — Other Ambulatory Visit: Payer: Self-pay | Admitting: Family Medicine

## 2021-03-04 NOTE — Telephone Encounter (Signed)
Patient refill request --- he states that sometimes things can get "shuffled"  Around at pharmacy, so just wanted to make sure we received request for his meds.  zolpidem (AMBIEN) 10 MG tablet [[id number]]   [place], [place] -

## 2021-03-04 NOTE — Telephone Encounter (Signed)
Requesting: zolpidem  Contract: 04/11/20 UDS: n/a Last Visit: 01/09/21 Next Visit:04/11/21 Last Refill:08/01/20(90,1)  Please Advise. Medication pending

## 2021-03-10 NOTE — Telephone Encounter (Signed)
Confirmed with pharmacy rx was picked up on 5/4

## 2021-03-19 DIAGNOSIS — N178 Other acute kidney failure: Secondary | ICD-10-CM | POA: Diagnosis not present

## 2021-03-19 DIAGNOSIS — N1831 Chronic kidney disease, stage 3a: Secondary | ICD-10-CM | POA: Diagnosis not present

## 2021-03-19 DIAGNOSIS — Z936 Other artificial openings of urinary tract status: Secondary | ICD-10-CM | POA: Diagnosis not present

## 2021-03-25 ENCOUNTER — Telehealth: Payer: Self-pay

## 2021-03-25 NOTE — Telephone Encounter (Signed)
Spoke with pt and advised he go to ED. He declined stating he was there for 5 days, had a catheter placed which ended in a result of surgery the last time he went. He would like to know if he can take ivermectin 3mg  based on weight, he has some left over. He took 5mg  nd his wife took 6mg  yesterday.  Please Advise

## 2021-03-25 NOTE — Telephone Encounter (Signed)
Patient called back - he called earlier this morning and has not heard from anyone yet.  He is having some breathing issues when he is walking to one place to the next in the house.  No current SOB issues. His wife has same symptoms. Both are COVID positive.  Please advise.  Patient can be called at [phone].

## 2021-03-25 NOTE — Telephone Encounter (Signed)
[facility] - Client Client Site [place] - Day Physician Crissie Sickles - MD Contact Type Call Who Is Calling Patient / Member / Family / Caregiver Caller Name Alexander [name] Caller Phone Number [phone] Patient Name Alexander [name] Patient DOB 25-Dec-1945 Call Type Message Only Information Provided Reason for Call Medication Question / Request Initial Comment Caller was positive for COVID. He is having a hard time breathing and feels weak. Additional Comment Caller would like to send to the office ASAP. He is very frustrated he cannot speak to them directly. Declined an after hours nurse triage. Provided hours. He would like medicine just called in directly to [facility] and then to be called and let him know it is done

## 2021-03-26 NOTE — Telephone Encounter (Signed)
I strongly agree with the recommendation to be evalutated in the emergency department: he has end stage kidney disease and is at high risk for complications from covid and sometimes pt with his risk are considered for admission to the hospital for observation ever if only mild or moderate covid.

## 2021-03-26 NOTE — Telephone Encounter (Signed)
LM for pt to return call regarding recommendations.  

## 2021-03-26 NOTE — Telephone Encounter (Signed)
Spoke with patient's wife, Opal Sidles [facility]) regarding PCP recommendations and strongly advised to be evaluated.

## 2021-04-02 DIAGNOSIS — Z8616 Personal history of COVID-19: Secondary | ICD-10-CM | POA: Diagnosis not present

## 2021-04-02 DIAGNOSIS — R9431 Abnormal electrocardiogram [ECG] [EKG]: Secondary | ICD-10-CM | POA: Diagnosis not present

## 2021-04-02 DIAGNOSIS — E1122 Type 2 diabetes mellitus with diabetic chronic kidney disease: Secondary | ICD-10-CM | POA: Diagnosis present

## 2021-04-02 DIAGNOSIS — N2889 Other specified disorders of kidney and ureter: Secondary | ICD-10-CM | POA: Diagnosis not present

## 2021-04-02 DIAGNOSIS — K5900 Constipation, unspecified: Secondary | ICD-10-CM | POA: Diagnosis not present

## 2021-04-02 DIAGNOSIS — N189 Chronic kidney disease, unspecified: Secondary | ICD-10-CM | POA: Diagnosis not present

## 2021-04-02 DIAGNOSIS — Z1612 Extended spectrum beta lactamase (ESBL) resistance: Secondary | ICD-10-CM | POA: Diagnosis present

## 2021-04-02 DIAGNOSIS — Z20822 Contact with and (suspected) exposure to covid-19: Secondary | ICD-10-CM | POA: Diagnosis not present

## 2021-04-02 DIAGNOSIS — R0609 Other forms of dyspnea: Secondary | ICD-10-CM | POA: Diagnosis not present

## 2021-04-02 DIAGNOSIS — E871 Hypo-osmolality and hyponatremia: Secondary | ICD-10-CM | POA: Diagnosis not present

## 2021-04-02 DIAGNOSIS — I129 Hypertensive chronic kidney disease with stage 1 through stage 4 chronic kidney disease, or unspecified chronic kidney disease: Secondary | ICD-10-CM | POA: Diagnosis present

## 2021-04-02 DIAGNOSIS — N184 Chronic kidney disease, stage 4 (severe): Secondary | ICD-10-CM | POA: Diagnosis not present

## 2021-04-02 DIAGNOSIS — N390 Urinary tract infection, site not specified: Secondary | ICD-10-CM | POA: Diagnosis not present

## 2021-04-02 DIAGNOSIS — Z906 Acquired absence of other parts of urinary tract: Secondary | ICD-10-CM | POA: Diagnosis not present

## 2021-04-02 DIAGNOSIS — E785 Hyperlipidemia, unspecified: Secondary | ICD-10-CM | POA: Diagnosis present

## 2021-04-02 DIAGNOSIS — E1169 Type 2 diabetes mellitus with other specified complication: Secondary | ICD-10-CM | POA: Diagnosis not present

## 2021-04-02 DIAGNOSIS — E860 Dehydration: Secondary | ICD-10-CM | POA: Diagnosis not present

## 2021-04-02 DIAGNOSIS — K219 Gastro-esophageal reflux disease without esophagitis: Secondary | ICD-10-CM | POA: Diagnosis present

## 2021-04-02 DIAGNOSIS — E872 Acidosis: Secondary | ICD-10-CM | POA: Diagnosis present

## 2021-04-02 DIAGNOSIS — N179 Acute kidney failure, unspecified: Secondary | ICD-10-CM | POA: Diagnosis not present

## 2021-04-02 DIAGNOSIS — Z1639 Resistance to other specified antimicrobial drug: Secondary | ICD-10-CM | POA: Diagnosis not present

## 2021-04-02 DIAGNOSIS — E559 Vitamin D deficiency, unspecified: Secondary | ICD-10-CM | POA: Diagnosis present

## 2021-04-02 DIAGNOSIS — B9620 Unspecified Escherichia coli [E. coli] as the cause of diseases classified elsewhere: Secondary | ICD-10-CM | POA: Diagnosis not present

## 2021-04-02 DIAGNOSIS — E1165 Type 2 diabetes mellitus with hyperglycemia: Secondary | ICD-10-CM | POA: Diagnosis present

## 2021-04-02 DIAGNOSIS — N281 Cyst of kidney, acquired: Secondary | ICD-10-CM | POA: Diagnosis not present

## 2021-04-02 DIAGNOSIS — R0602 Shortness of breath: Secondary | ICD-10-CM | POA: Diagnosis not present

## 2021-04-02 DIAGNOSIS — N139 Obstructive and reflux uropathy, unspecified: Secondary | ICD-10-CM | POA: Diagnosis not present

## 2021-04-02 DIAGNOSIS — K6289 Other specified diseases of anus and rectum: Secondary | ICD-10-CM | POA: Diagnosis not present

## 2021-04-02 DIAGNOSIS — Z960 Presence of urogenital implants: Secondary | ICD-10-CM | POA: Diagnosis not present

## 2021-04-02 DIAGNOSIS — B952 Enterococcus as the cause of diseases classified elsewhere: Secondary | ICD-10-CM | POA: Diagnosis not present

## 2021-04-02 DIAGNOSIS — I9589 Other hypotension: Secondary | ICD-10-CM | POA: Diagnosis present

## 2021-04-02 DIAGNOSIS — C679 Malignant neoplasm of bladder, unspecified: Secondary | ICD-10-CM | POA: Diagnosis not present

## 2021-04-02 DIAGNOSIS — B9561 Methicillin susceptible Staphylococcus aureus infection as the cause of diseases classified elsewhere: Secondary | ICD-10-CM | POA: Diagnosis not present

## 2021-04-02 DIAGNOSIS — E1142 Type 2 diabetes mellitus with diabetic polyneuropathy: Secondary | ICD-10-CM | POA: Diagnosis present

## 2021-04-02 DIAGNOSIS — Z8551 Personal history of malignant neoplasm of bladder: Secondary | ICD-10-CM | POA: Diagnosis not present

## 2021-04-02 DIAGNOSIS — E876 Hypokalemia: Secondary | ICD-10-CM | POA: Diagnosis not present

## 2021-04-08 DIAGNOSIS — B9629 Other Escherichia coli [E. coli] as the cause of diseases classified elsewhere: Secondary | ICD-10-CM | POA: Diagnosis not present

## 2021-04-08 DIAGNOSIS — N390 Urinary tract infection, site not specified: Secondary | ICD-10-CM | POA: Diagnosis not present

## 2021-04-08 DIAGNOSIS — Z1612 Extended spectrum beta lactamase (ESBL) resistance: Secondary | ICD-10-CM | POA: Diagnosis not present

## 2021-04-09 DIAGNOSIS — B9629 Other Escherichia coli [E. coli] as the cause of diseases classified elsewhere: Secondary | ICD-10-CM | POA: Diagnosis not present

## 2021-04-09 DIAGNOSIS — Z1612 Extended spectrum beta lactamase (ESBL) resistance: Secondary | ICD-10-CM | POA: Diagnosis not present

## 2021-04-09 DIAGNOSIS — N390 Urinary tract infection, site not specified: Secondary | ICD-10-CM | POA: Diagnosis not present

## 2021-04-10 DIAGNOSIS — Z1612 Extended spectrum beta lactamase (ESBL) resistance: Secondary | ICD-10-CM | POA: Diagnosis not present

## 2021-04-10 DIAGNOSIS — B9629 Other Escherichia coli [E. coli] as the cause of diseases classified elsewhere: Secondary | ICD-10-CM | POA: Diagnosis not present

## 2021-04-10 DIAGNOSIS — N390 Urinary tract infection, site not specified: Secondary | ICD-10-CM | POA: Diagnosis not present

## 2021-04-11 ENCOUNTER — Ambulatory Visit: Payer: Medicare Other | Admitting: Family Medicine

## 2021-04-11 DIAGNOSIS — B9629 Other Escherichia coli [E. coli] as the cause of diseases classified elsewhere: Secondary | ICD-10-CM | POA: Diagnosis not present

## 2021-04-11 DIAGNOSIS — N390 Urinary tract infection, site not specified: Secondary | ICD-10-CM | POA: Diagnosis not present

## 2021-04-11 DIAGNOSIS — Z1612 Extended spectrum beta lactamase (ESBL) resistance: Secondary | ICD-10-CM | POA: Diagnosis not present

## 2021-04-12 DIAGNOSIS — B9629 Other Escherichia coli [E. coli] as the cause of diseases classified elsewhere: Secondary | ICD-10-CM | POA: Diagnosis not present

## 2021-04-12 DIAGNOSIS — N390 Urinary tract infection, site not specified: Secondary | ICD-10-CM | POA: Diagnosis not present

## 2021-04-12 DIAGNOSIS — Z1612 Extended spectrum beta lactamase (ESBL) resistance: Secondary | ICD-10-CM | POA: Diagnosis not present

## 2021-04-13 DIAGNOSIS — N390 Urinary tract infection, site not specified: Secondary | ICD-10-CM | POA: Diagnosis not present

## 2021-04-13 DIAGNOSIS — B9629 Other Escherichia coli [E. coli] as the cause of diseases classified elsewhere: Secondary | ICD-10-CM | POA: Diagnosis not present

## 2021-04-13 DIAGNOSIS — Z1612 Extended spectrum beta lactamase (ESBL) resistance: Secondary | ICD-10-CM | POA: Diagnosis not present

## 2021-04-14 DIAGNOSIS — Z1612 Extended spectrum beta lactamase (ESBL) resistance: Secondary | ICD-10-CM | POA: Diagnosis not present

## 2021-04-14 DIAGNOSIS — B9629 Other Escherichia coli [E. coli] as the cause of diseases classified elsewhere: Secondary | ICD-10-CM | POA: Diagnosis not present

## 2021-04-14 DIAGNOSIS — N390 Urinary tract infection, site not specified: Secondary | ICD-10-CM | POA: Diagnosis not present

## 2021-05-21 ENCOUNTER — Telehealth: Payer: Self-pay | Admitting: Family Medicine

## 2021-05-21 NOTE — Telephone Encounter (Signed)
Spoke with patient and he declined that AWV wants no call backs

## 2021-05-26 DIAGNOSIS — N319 Neuromuscular dysfunction of bladder, unspecified: Secondary | ICD-10-CM | POA: Diagnosis not present

## 2021-05-26 DIAGNOSIS — Z466 Encounter for fitting and adjustment of urinary device: Secondary | ICD-10-CM | POA: Diagnosis not present

## 2021-05-26 DIAGNOSIS — N1330 Unspecified hydronephrosis: Secondary | ICD-10-CM | POA: Diagnosis not present

## 2021-06-20 ENCOUNTER — Other Ambulatory Visit: Payer: Self-pay | Admitting: Family Medicine

## 2021-06-20 NOTE — Telephone Encounter (Signed)
Requesting: zolpidem Contract: 04/11/20 UDS: N/A Last Visit:01/09/21 Next Visit: advised to f/u June Last Refill: 03/05/21(90,0)  Please review and advise, med pending

## 2021-07-09 DIAGNOSIS — A419 Sepsis, unspecified organism: Secondary | ICD-10-CM | POA: Diagnosis not present

## 2021-07-09 DIAGNOSIS — D631 Anemia in chronic kidney disease: Secondary | ICD-10-CM | POA: Diagnosis present

## 2021-07-09 DIAGNOSIS — R0689 Other abnormalities of breathing: Secondary | ICD-10-CM | POA: Diagnosis not present

## 2021-07-09 DIAGNOSIS — K219 Gastro-esophageal reflux disease without esophagitis: Secondary | ICD-10-CM | POA: Diagnosis not present

## 2021-07-09 DIAGNOSIS — N390 Urinary tract infection, site not specified: Secondary | ICD-10-CM | POA: Diagnosis not present

## 2021-07-09 DIAGNOSIS — E871 Hypo-osmolality and hyponatremia: Secondary | ICD-10-CM | POA: Diagnosis not present

## 2021-07-09 DIAGNOSIS — N185 Chronic kidney disease, stage 5: Secondary | ICD-10-CM | POA: Diagnosis not present

## 2021-07-09 DIAGNOSIS — N184 Chronic kidney disease, stage 4 (severe): Secondary | ICD-10-CM | POA: Diagnosis not present

## 2021-07-09 DIAGNOSIS — R000 Tachycardia, unspecified: Secondary | ICD-10-CM | POA: Diagnosis not present

## 2021-07-09 DIAGNOSIS — T83511A Infection and inflammatory reaction due to indwelling urethral catheter, initial encounter: Secondary | ICD-10-CM | POA: Diagnosis not present

## 2021-07-09 DIAGNOSIS — N179 Acute kidney failure, unspecified: Secondary | ICD-10-CM | POA: Diagnosis not present

## 2021-07-09 DIAGNOSIS — Z906 Acquired absence of other parts of urinary tract: Secondary | ICD-10-CM | POA: Diagnosis not present

## 2021-07-09 DIAGNOSIS — D638 Anemia in other chronic diseases classified elsewhere: Secondary | ICD-10-CM | POA: Diagnosis not present

## 2021-07-09 DIAGNOSIS — R059 Cough, unspecified: Secondary | ICD-10-CM | POA: Diagnosis not present

## 2021-07-09 DIAGNOSIS — Z8551 Personal history of malignant neoplasm of bladder: Secondary | ICD-10-CM | POA: Diagnosis not present

## 2021-07-09 DIAGNOSIS — T83518A Infection and inflammatory reaction due to other urinary catheter, initial encounter: Secondary | ICD-10-CM | POA: Diagnosis present

## 2021-07-09 DIAGNOSIS — R110 Nausea: Secondary | ICD-10-CM | POA: Diagnosis not present

## 2021-07-09 DIAGNOSIS — N3001 Acute cystitis with hematuria: Secondary | ICD-10-CM | POA: Diagnosis present

## 2021-07-09 DIAGNOSIS — E1165 Type 2 diabetes mellitus with hyperglycemia: Secondary | ICD-10-CM | POA: Diagnosis present

## 2021-07-09 DIAGNOSIS — E559 Vitamin D deficiency, unspecified: Secondary | ICD-10-CM | POA: Diagnosis not present

## 2021-07-09 DIAGNOSIS — R531 Weakness: Secondary | ICD-10-CM | POA: Diagnosis not present

## 2021-07-09 DIAGNOSIS — Z20822 Contact with and (suspected) exposure to covid-19: Secondary | ICD-10-CM | POA: Diagnosis not present

## 2021-07-09 DIAGNOSIS — B9620 Unspecified Escherichia coli [E. coli] as the cause of diseases classified elsewhere: Secondary | ICD-10-CM | POA: Diagnosis not present

## 2021-07-09 DIAGNOSIS — E1122 Type 2 diabetes mellitus with diabetic chronic kidney disease: Secondary | ICD-10-CM | POA: Diagnosis not present

## 2021-07-09 DIAGNOSIS — R509 Fever, unspecified: Secondary | ICD-10-CM | POA: Diagnosis not present

## 2021-07-09 DIAGNOSIS — E872 Acidosis: Secondary | ICD-10-CM | POA: Diagnosis not present

## 2021-07-09 DIAGNOSIS — Z8616 Personal history of COVID-19: Secondary | ICD-10-CM | POA: Diagnosis not present

## 2021-07-09 DIAGNOSIS — R6520 Severe sepsis without septic shock: Secondary | ICD-10-CM | POA: Diagnosis not present

## 2021-07-15 DIAGNOSIS — N390 Urinary tract infection, site not specified: Secondary | ICD-10-CM | POA: Diagnosis not present

## 2021-07-15 DIAGNOSIS — Z1612 Extended spectrum beta lactamase (ESBL) resistance: Secondary | ICD-10-CM | POA: Diagnosis not present

## 2021-07-15 DIAGNOSIS — B9629 Other Escherichia coli [E. coli] as the cause of diseases classified elsewhere: Secondary | ICD-10-CM | POA: Diagnosis not present

## 2021-07-16 DIAGNOSIS — B9629 Other Escherichia coli [E. coli] as the cause of diseases classified elsewhere: Secondary | ICD-10-CM | POA: Diagnosis not present

## 2021-07-16 DIAGNOSIS — Z1612 Extended spectrum beta lactamase (ESBL) resistance: Secondary | ICD-10-CM | POA: Diagnosis not present

## 2021-07-16 DIAGNOSIS — N390 Urinary tract infection, site not specified: Secondary | ICD-10-CM | POA: Diagnosis not present

## 2021-08-04 DIAGNOSIS — E782 Mixed hyperlipidemia: Secondary | ICD-10-CM | POA: Diagnosis not present

## 2021-08-04 DIAGNOSIS — D631 Anemia in chronic kidney disease: Secondary | ICD-10-CM | POA: Diagnosis not present

## 2021-08-04 DIAGNOSIS — E1122 Type 2 diabetes mellitus with diabetic chronic kidney disease: Secondary | ICD-10-CM | POA: Diagnosis not present

## 2021-08-04 DIAGNOSIS — M542 Cervicalgia: Secondary | ICD-10-CM | POA: Diagnosis not present

## 2021-08-04 DIAGNOSIS — N185 Chronic kidney disease, stage 5: Secondary | ICD-10-CM | POA: Diagnosis not present

## 2021-08-04 DIAGNOSIS — Z7984 Long term (current) use of oral hypoglycemic drugs: Secondary | ICD-10-CM | POA: Diagnosis not present

## 2021-08-06 DIAGNOSIS — N185 Chronic kidney disease, stage 5: Secondary | ICD-10-CM | POA: Diagnosis not present

## 2021-08-06 DIAGNOSIS — R808 Other proteinuria: Secondary | ICD-10-CM | POA: Diagnosis not present

## 2021-08-06 DIAGNOSIS — E872 Acidosis, unspecified: Secondary | ICD-10-CM | POA: Diagnosis not present

## 2021-08-06 DIAGNOSIS — E1122 Type 2 diabetes mellitus with diabetic chronic kidney disease: Secondary | ICD-10-CM | POA: Diagnosis not present

## 2021-08-13 DIAGNOSIS — Z466 Encounter for fitting and adjustment of urinary device: Secondary | ICD-10-CM | POA: Diagnosis not present

## 2021-08-13 DIAGNOSIS — Z888 Allergy status to other drugs, medicaments and biological substances status: Secondary | ICD-10-CM | POA: Diagnosis not present

## 2021-08-13 DIAGNOSIS — Z885 Allergy status to narcotic agent status: Secondary | ICD-10-CM | POA: Diagnosis not present

## 2021-08-13 DIAGNOSIS — Z436 Encounter for attention to other artificial openings of urinary tract: Secondary | ICD-10-CM | POA: Diagnosis not present

## 2021-08-13 DIAGNOSIS — N1330 Unspecified hydronephrosis: Secondary | ICD-10-CM | POA: Diagnosis not present

## 2021-10-03 DIAGNOSIS — E782 Mixed hyperlipidemia: Secondary | ICD-10-CM | POA: Diagnosis not present

## 2021-10-03 DIAGNOSIS — M542 Cervicalgia: Secondary | ICD-10-CM | POA: Diagnosis not present

## 2021-10-03 DIAGNOSIS — D631 Anemia in chronic kidney disease: Secondary | ICD-10-CM | POA: Diagnosis not present

## 2021-10-03 DIAGNOSIS — Z9889 Other specified postprocedural states: Secondary | ICD-10-CM | POA: Diagnosis not present

## 2021-10-03 DIAGNOSIS — E1122 Type 2 diabetes mellitus with diabetic chronic kidney disease: Secondary | ICD-10-CM | POA: Diagnosis not present

## 2021-10-03 DIAGNOSIS — J329 Chronic sinusitis, unspecified: Secondary | ICD-10-CM | POA: Diagnosis not present

## 2021-10-03 DIAGNOSIS — N185 Chronic kidney disease, stage 5: Secondary | ICD-10-CM | POA: Diagnosis not present

## 2021-10-03 DIAGNOSIS — B9689 Other specified bacterial agents as the cause of diseases classified elsewhere: Secondary | ICD-10-CM | POA: Diagnosis not present

## 2021-10-16 DIAGNOSIS — E872 Acidosis, unspecified: Secondary | ICD-10-CM | POA: Diagnosis not present

## 2021-10-16 DIAGNOSIS — N185 Chronic kidney disease, stage 5: Secondary | ICD-10-CM | POA: Diagnosis not present

## 2021-10-16 DIAGNOSIS — E1122 Type 2 diabetes mellitus with diabetic chronic kidney disease: Secondary | ICD-10-CM | POA: Diagnosis not present

## 2021-10-16 DIAGNOSIS — Z87891 Personal history of nicotine dependence: Secondary | ICD-10-CM | POA: Diagnosis not present

## 2021-10-16 DIAGNOSIS — N139 Obstructive and reflux uropathy, unspecified: Secondary | ICD-10-CM | POA: Diagnosis not present

## 2021-10-16 DIAGNOSIS — D631 Anemia in chronic kidney disease: Secondary | ICD-10-CM | POA: Diagnosis not present

## 2021-10-30 DIAGNOSIS — D631 Anemia in chronic kidney disease: Secondary | ICD-10-CM | POA: Diagnosis not present

## 2021-10-30 DIAGNOSIS — N185 Chronic kidney disease, stage 5: Secondary | ICD-10-CM | POA: Diagnosis not present

## 2021-11-10 DIAGNOSIS — Z936 Other artificial openings of urinary tract status: Secondary | ICD-10-CM | POA: Diagnosis not present

## 2021-11-10 DIAGNOSIS — N1330 Unspecified hydronephrosis: Secondary | ICD-10-CM | POA: Diagnosis not present

## 2021-11-10 DIAGNOSIS — R7309 Other abnormal glucose: Secondary | ICD-10-CM | POA: Diagnosis not present

## 2021-11-11 DIAGNOSIS — D631 Anemia in chronic kidney disease: Secondary | ICD-10-CM | POA: Diagnosis not present

## 2021-11-11 DIAGNOSIS — N185 Chronic kidney disease, stage 5: Secondary | ICD-10-CM | POA: Diagnosis not present

## 2021-11-13 DIAGNOSIS — D631 Anemia in chronic kidney disease: Secondary | ICD-10-CM | POA: Diagnosis not present

## 2021-11-13 DIAGNOSIS — N185 Chronic kidney disease, stage 5: Secondary | ICD-10-CM | POA: Diagnosis not present

## 2021-11-26 DIAGNOSIS — D631 Anemia in chronic kidney disease: Secondary | ICD-10-CM | POA: Diagnosis not present

## 2021-11-26 DIAGNOSIS — N185 Chronic kidney disease, stage 5: Secondary | ICD-10-CM | POA: Diagnosis not present

## 2021-12-09 DIAGNOSIS — N185 Chronic kidney disease, stage 5: Secondary | ICD-10-CM | POA: Diagnosis not present

## 2021-12-09 DIAGNOSIS — D631 Anemia in chronic kidney disease: Secondary | ICD-10-CM | POA: Diagnosis not present

## 2021-12-11 DIAGNOSIS — N185 Chronic kidney disease, stage 5: Secondary | ICD-10-CM | POA: Diagnosis not present

## 2021-12-11 DIAGNOSIS — D631 Anemia in chronic kidney disease: Secondary | ICD-10-CM | POA: Diagnosis not present

## 2021-12-22 DIAGNOSIS — D631 Anemia in chronic kidney disease: Secondary | ICD-10-CM | POA: Diagnosis not present

## 2021-12-22 DIAGNOSIS — E782 Mixed hyperlipidemia: Secondary | ICD-10-CM | POA: Diagnosis not present

## 2021-12-22 DIAGNOSIS — M25512 Pain in left shoulder: Secondary | ICD-10-CM | POA: Diagnosis not present

## 2021-12-22 DIAGNOSIS — Z125 Encounter for screening for malignant neoplasm of prostate: Secondary | ICD-10-CM | POA: Diagnosis not present

## 2021-12-22 DIAGNOSIS — Z789 Other specified health status: Secondary | ICD-10-CM | POA: Diagnosis not present

## 2021-12-22 DIAGNOSIS — N179 Acute kidney failure, unspecified: Secondary | ICD-10-CM | POA: Diagnosis not present

## 2021-12-22 DIAGNOSIS — T83591A Infection and inflammatory reaction due to implanted urinary sphincter, initial encounter: Secondary | ICD-10-CM | POA: Diagnosis not present

## 2021-12-22 DIAGNOSIS — G4700 Insomnia, unspecified: Secondary | ICD-10-CM | POA: Diagnosis not present

## 2021-12-22 DIAGNOSIS — N185 Chronic kidney disease, stage 5: Secondary | ICD-10-CM | POA: Diagnosis not present

## 2021-12-22 DIAGNOSIS — Z0000 Encounter for general adult medical examination without abnormal findings: Secondary | ICD-10-CM | POA: Diagnosis not present

## 2021-12-22 DIAGNOSIS — E1122 Type 2 diabetes mellitus with diabetic chronic kidney disease: Secondary | ICD-10-CM | POA: Diagnosis not present

## 2022-01-05 DIAGNOSIS — N185 Chronic kidney disease, stage 5: Secondary | ICD-10-CM | POA: Diagnosis not present

## 2022-01-05 DIAGNOSIS — E1122 Type 2 diabetes mellitus with diabetic chronic kidney disease: Secondary | ICD-10-CM | POA: Diagnosis not present

## 2022-01-05 DIAGNOSIS — R319 Hematuria, unspecified: Secondary | ICD-10-CM | POA: Diagnosis not present

## 2022-01-09 DIAGNOSIS — N139 Obstructive and reflux uropathy, unspecified: Secondary | ICD-10-CM | POA: Diagnosis not present

## 2022-01-09 DIAGNOSIS — Z66 Do not resuscitate: Secondary | ICD-10-CM | POA: Diagnosis present

## 2022-01-09 DIAGNOSIS — Z87440 Personal history of urinary (tract) infections: Secondary | ICD-10-CM | POA: Diagnosis not present

## 2022-01-09 DIAGNOSIS — R7881 Bacteremia: Secondary | ICD-10-CM | POA: Diagnosis present

## 2022-01-09 DIAGNOSIS — Z95828 Presence of other vascular implants and grafts: Secondary | ICD-10-CM | POA: Diagnosis not present

## 2022-01-09 DIAGNOSIS — Z809 Family history of malignant neoplasm, unspecified: Secondary | ICD-10-CM | POA: Diagnosis not present

## 2022-01-09 DIAGNOSIS — E1122 Type 2 diabetes mellitus with diabetic chronic kidney disease: Secondary | ICD-10-CM | POA: Diagnosis present

## 2022-01-09 DIAGNOSIS — Z841 Family history of disorders of kidney and ureter: Secondary | ICD-10-CM | POA: Diagnosis not present

## 2022-01-09 DIAGNOSIS — B951 Streptococcus, group B, as the cause of diseases classified elsewhere: Secondary | ICD-10-CM | POA: Diagnosis not present

## 2022-01-09 DIAGNOSIS — Z7982 Long term (current) use of aspirin: Secondary | ICD-10-CM | POA: Diagnosis not present

## 2022-01-09 DIAGNOSIS — B9629 Other Escherichia coli [E. coli] as the cause of diseases classified elsewhere: Secondary | ICD-10-CM | POA: Diagnosis not present

## 2022-01-09 DIAGNOSIS — Z8551 Personal history of malignant neoplasm of bladder: Secondary | ICD-10-CM | POA: Diagnosis not present

## 2022-01-09 DIAGNOSIS — B9620 Unspecified Escherichia coli [E. coli] as the cause of diseases classified elsewhere: Secondary | ICD-10-CM | POA: Diagnosis present

## 2022-01-09 DIAGNOSIS — I120 Hypertensive chronic kidney disease with stage 5 chronic kidney disease or end stage renal disease: Secondary | ICD-10-CM | POA: Diagnosis present

## 2022-01-09 DIAGNOSIS — Z1612 Extended spectrum beta lactamase (ESBL) resistance: Secondary | ICD-10-CM | POA: Diagnosis present

## 2022-01-09 DIAGNOSIS — E782 Mixed hyperlipidemia: Secondary | ICD-10-CM | POA: Diagnosis present

## 2022-01-09 DIAGNOSIS — E872 Acidosis, unspecified: Secondary | ICD-10-CM | POA: Diagnosis not present

## 2022-01-09 DIAGNOSIS — N1330 Unspecified hydronephrosis: Secondary | ICD-10-CM | POA: Diagnosis not present

## 2022-01-09 DIAGNOSIS — Z9889 Other specified postprocedural states: Secondary | ICD-10-CM | POA: Diagnosis not present

## 2022-01-09 DIAGNOSIS — G4700 Insomnia, unspecified: Secondary | ICD-10-CM | POA: Diagnosis not present

## 2022-01-09 DIAGNOSIS — M898X9 Other specified disorders of bone, unspecified site: Secondary | ICD-10-CM | POA: Diagnosis not present

## 2022-01-09 DIAGNOSIS — D631 Anemia in chronic kidney disease: Secondary | ICD-10-CM | POA: Diagnosis present

## 2022-01-09 DIAGNOSIS — H9190 Unspecified hearing loss, unspecified ear: Secondary | ICD-10-CM | POA: Diagnosis present

## 2022-01-09 DIAGNOSIS — E559 Vitamin D deficiency, unspecified: Secondary | ICD-10-CM | POA: Diagnosis not present

## 2022-01-09 DIAGNOSIS — N179 Acute kidney failure, unspecified: Secondary | ICD-10-CM | POA: Diagnosis not present

## 2022-01-09 DIAGNOSIS — R8279 Other abnormal findings on microbiological examination of urine: Secondary | ICD-10-CM | POA: Diagnosis not present

## 2022-01-09 DIAGNOSIS — N390 Urinary tract infection, site not specified: Secondary | ICD-10-CM | POA: Diagnosis not present

## 2022-01-09 DIAGNOSIS — Z87891 Personal history of nicotine dependence: Secondary | ICD-10-CM | POA: Diagnosis not present

## 2022-01-09 DIAGNOSIS — Z960 Presence of urogenital implants: Secondary | ICD-10-CM | POA: Diagnosis not present

## 2022-01-09 DIAGNOSIS — N319 Neuromuscular dysfunction of bladder, unspecified: Secondary | ICD-10-CM | POA: Diagnosis not present

## 2022-01-09 DIAGNOSIS — N185 Chronic kidney disease, stage 5: Secondary | ICD-10-CM | POA: Diagnosis present

## 2022-01-09 DIAGNOSIS — N136 Pyonephrosis: Secondary | ICD-10-CM | POA: Diagnosis present

## 2022-01-09 DIAGNOSIS — Z794 Long term (current) use of insulin: Secondary | ICD-10-CM | POA: Diagnosis not present

## 2022-01-10 DIAGNOSIS — Z960 Presence of urogenital implants: Secondary | ICD-10-CM | POA: Diagnosis not present

## 2022-01-10 DIAGNOSIS — Z841 Family history of disorders of kidney and ureter: Secondary | ICD-10-CM | POA: Diagnosis not present

## 2022-01-10 DIAGNOSIS — D631 Anemia in chronic kidney disease: Secondary | ICD-10-CM | POA: Diagnosis present

## 2022-01-10 DIAGNOSIS — H9190 Unspecified hearing loss, unspecified ear: Secondary | ICD-10-CM | POA: Diagnosis present

## 2022-01-10 DIAGNOSIS — Z66 Do not resuscitate: Secondary | ICD-10-CM | POA: Diagnosis present

## 2022-01-10 DIAGNOSIS — Z1612 Extended spectrum beta lactamase (ESBL) resistance: Secondary | ICD-10-CM | POA: Diagnosis present

## 2022-01-10 DIAGNOSIS — E782 Mixed hyperlipidemia: Secondary | ICD-10-CM | POA: Diagnosis present

## 2022-01-10 DIAGNOSIS — Z809 Family history of malignant neoplasm, unspecified: Secondary | ICD-10-CM | POA: Diagnosis not present

## 2022-01-10 DIAGNOSIS — G4700 Insomnia, unspecified: Secondary | ICD-10-CM | POA: Diagnosis present

## 2022-01-10 DIAGNOSIS — B951 Streptococcus, group B, as the cause of diseases classified elsewhere: Secondary | ICD-10-CM | POA: Diagnosis not present

## 2022-01-10 DIAGNOSIS — N1330 Unspecified hydronephrosis: Secondary | ICD-10-CM | POA: Diagnosis not present

## 2022-01-10 DIAGNOSIS — N136 Pyonephrosis: Secondary | ICD-10-CM | POA: Diagnosis present

## 2022-01-10 DIAGNOSIS — N185 Chronic kidney disease, stage 5: Secondary | ICD-10-CM | POA: Diagnosis present

## 2022-01-10 DIAGNOSIS — N139 Obstructive and reflux uropathy, unspecified: Secondary | ICD-10-CM | POA: Diagnosis not present

## 2022-01-10 DIAGNOSIS — B9629 Other Escherichia coli [E. coli] as the cause of diseases classified elsewhere: Secondary | ICD-10-CM | POA: Diagnosis not present

## 2022-01-10 DIAGNOSIS — Z7982 Long term (current) use of aspirin: Secondary | ICD-10-CM | POA: Diagnosis not present

## 2022-01-10 DIAGNOSIS — M898X9 Other specified disorders of bone, unspecified site: Secondary | ICD-10-CM | POA: Diagnosis not present

## 2022-01-10 DIAGNOSIS — B9620 Unspecified Escherichia coli [E. coli] as the cause of diseases classified elsewhere: Secondary | ICD-10-CM | POA: Diagnosis present

## 2022-01-10 DIAGNOSIS — N319 Neuromuscular dysfunction of bladder, unspecified: Secondary | ICD-10-CM | POA: Diagnosis not present

## 2022-01-10 DIAGNOSIS — R7881 Bacteremia: Secondary | ICD-10-CM | POA: Diagnosis present

## 2022-01-10 DIAGNOSIS — N179 Acute kidney failure, unspecified: Secondary | ICD-10-CM | POA: Diagnosis not present

## 2022-01-10 DIAGNOSIS — I120 Hypertensive chronic kidney disease with stage 5 chronic kidney disease or end stage renal disease: Secondary | ICD-10-CM | POA: Diagnosis present

## 2022-01-10 DIAGNOSIS — Z87440 Personal history of urinary (tract) infections: Secondary | ICD-10-CM | POA: Diagnosis not present

## 2022-01-10 DIAGNOSIS — Z8551 Personal history of malignant neoplasm of bladder: Secondary | ICD-10-CM | POA: Diagnosis not present

## 2022-01-10 DIAGNOSIS — Z95828 Presence of other vascular implants and grafts: Secondary | ICD-10-CM | POA: Diagnosis not present

## 2022-01-10 DIAGNOSIS — Z9889 Other specified postprocedural states: Secondary | ICD-10-CM | POA: Diagnosis not present

## 2022-01-10 DIAGNOSIS — E559 Vitamin D deficiency, unspecified: Secondary | ICD-10-CM | POA: Diagnosis not present

## 2022-01-10 DIAGNOSIS — Z794 Long term (current) use of insulin: Secondary | ICD-10-CM | POA: Diagnosis not present

## 2022-01-10 DIAGNOSIS — E872 Acidosis, unspecified: Secondary | ICD-10-CM | POA: Diagnosis not present

## 2022-01-10 DIAGNOSIS — Z87891 Personal history of nicotine dependence: Secondary | ICD-10-CM | POA: Diagnosis not present

## 2022-01-10 DIAGNOSIS — E1122 Type 2 diabetes mellitus with diabetic chronic kidney disease: Secondary | ICD-10-CM | POA: Diagnosis present

## 2022-01-10 DIAGNOSIS — N390 Urinary tract infection, site not specified: Secondary | ICD-10-CM | POA: Diagnosis not present

## 2022-01-11 DIAGNOSIS — N179 Acute kidney failure, unspecified: Secondary | ICD-10-CM | POA: Diagnosis not present

## 2022-01-11 DIAGNOSIS — Z95828 Presence of other vascular implants and grafts: Secondary | ICD-10-CM | POA: Diagnosis not present

## 2022-01-11 DIAGNOSIS — E872 Acidosis, unspecified: Secondary | ICD-10-CM | POA: Diagnosis not present

## 2022-01-11 DIAGNOSIS — E1122 Type 2 diabetes mellitus with diabetic chronic kidney disease: Secondary | ICD-10-CM | POA: Diagnosis not present

## 2022-01-11 DIAGNOSIS — N1330 Unspecified hydronephrosis: Secondary | ICD-10-CM | POA: Diagnosis not present

## 2022-01-11 DIAGNOSIS — I120 Hypertensive chronic kidney disease with stage 5 chronic kidney disease or end stage renal disease: Secondary | ICD-10-CM | POA: Diagnosis not present

## 2022-01-11 DIAGNOSIS — N390 Urinary tract infection, site not specified: Secondary | ICD-10-CM | POA: Diagnosis not present

## 2022-01-11 DIAGNOSIS — Z1612 Extended spectrum beta lactamase (ESBL) resistance: Secondary | ICD-10-CM | POA: Diagnosis not present

## 2022-01-11 DIAGNOSIS — Z960 Presence of urogenital implants: Secondary | ICD-10-CM | POA: Diagnosis not present

## 2022-01-11 DIAGNOSIS — Z794 Long term (current) use of insulin: Secondary | ICD-10-CM | POA: Diagnosis not present

## 2022-01-11 DIAGNOSIS — M898X9 Other specified disorders of bone, unspecified site: Secondary | ICD-10-CM | POA: Diagnosis not present

## 2022-01-11 DIAGNOSIS — N185 Chronic kidney disease, stage 5: Secondary | ICD-10-CM | POA: Diagnosis not present

## 2022-01-11 DIAGNOSIS — B9620 Unspecified Escherichia coli [E. coli] as the cause of diseases classified elsewhere: Secondary | ICD-10-CM | POA: Diagnosis not present

## 2022-01-11 DIAGNOSIS — D631 Anemia in chronic kidney disease: Secondary | ICD-10-CM | POA: Diagnosis not present

## 2022-01-12 DIAGNOSIS — B951 Streptococcus, group B, as the cause of diseases classified elsewhere: Secondary | ICD-10-CM | POA: Diagnosis not present

## 2022-01-12 DIAGNOSIS — E872 Acidosis, unspecified: Secondary | ICD-10-CM | POA: Diagnosis not present

## 2022-01-12 DIAGNOSIS — N139 Obstructive and reflux uropathy, unspecified: Secondary | ICD-10-CM | POA: Diagnosis not present

## 2022-01-12 DIAGNOSIS — B9620 Unspecified Escherichia coli [E. coli] as the cause of diseases classified elsewhere: Secondary | ICD-10-CM | POA: Diagnosis not present

## 2022-01-12 DIAGNOSIS — Z960 Presence of urogenital implants: Secondary | ICD-10-CM | POA: Diagnosis not present

## 2022-01-12 DIAGNOSIS — Z1612 Extended spectrum beta lactamase (ESBL) resistance: Secondary | ICD-10-CM | POA: Diagnosis not present

## 2022-01-12 DIAGNOSIS — N185 Chronic kidney disease, stage 5: Secondary | ICD-10-CM | POA: Diagnosis not present

## 2022-01-12 DIAGNOSIS — E1122 Type 2 diabetes mellitus with diabetic chronic kidney disease: Secondary | ICD-10-CM | POA: Diagnosis not present

## 2022-01-12 DIAGNOSIS — N319 Neuromuscular dysfunction of bladder, unspecified: Secondary | ICD-10-CM | POA: Diagnosis not present

## 2022-01-12 DIAGNOSIS — Z9889 Other specified postprocedural states: Secondary | ICD-10-CM | POA: Diagnosis not present

## 2022-01-12 DIAGNOSIS — N179 Acute kidney failure, unspecified: Secondary | ICD-10-CM | POA: Diagnosis not present

## 2022-01-12 DIAGNOSIS — D631 Anemia in chronic kidney disease: Secondary | ICD-10-CM | POA: Diagnosis not present

## 2022-01-12 DIAGNOSIS — R7881 Bacteremia: Secondary | ICD-10-CM | POA: Diagnosis not present

## 2022-01-12 DIAGNOSIS — N390 Urinary tract infection, site not specified: Secondary | ICD-10-CM | POA: Diagnosis not present

## 2022-01-12 DIAGNOSIS — Z794 Long term (current) use of insulin: Secondary | ICD-10-CM | POA: Diagnosis not present

## 2022-01-12 DIAGNOSIS — Z8551 Personal history of malignant neoplasm of bladder: Secondary | ICD-10-CM | POA: Diagnosis not present

## 2022-01-13 DIAGNOSIS — N3090 Cystitis, unspecified without hematuria: Secondary | ICD-10-CM | POA: Diagnosis not present

## 2022-01-14 DIAGNOSIS — N3090 Cystitis, unspecified without hematuria: Secondary | ICD-10-CM | POA: Diagnosis not present

## 2022-01-14 DIAGNOSIS — D631 Anemia in chronic kidney disease: Secondary | ICD-10-CM | POA: Diagnosis not present

## 2022-01-14 DIAGNOSIS — N185 Chronic kidney disease, stage 5: Secondary | ICD-10-CM | POA: Diagnosis not present

## 2022-01-15 DIAGNOSIS — N3090 Cystitis, unspecified without hematuria: Secondary | ICD-10-CM | POA: Diagnosis not present

## 2022-01-16 DIAGNOSIS — N3090 Cystitis, unspecified without hematuria: Secondary | ICD-10-CM | POA: Diagnosis not present

## 2022-01-17 DIAGNOSIS — N3090 Cystitis, unspecified without hematuria: Secondary | ICD-10-CM | POA: Diagnosis not present

## 2022-01-18 DIAGNOSIS — N3090 Cystitis, unspecified without hematuria: Secondary | ICD-10-CM | POA: Diagnosis not present

## 2022-01-26 ENCOUNTER — Telehealth: Payer: Self-pay

## 2022-01-26 NOTE — Telephone Encounter (Signed)
Spoke with pt to schedule AWV in office. Patient declined to schedule wellness visit at this time.   

## 2022-02-02 DIAGNOSIS — Z466 Encounter for fitting and adjustment of urinary device: Secondary | ICD-10-CM | POA: Diagnosis not present

## 2022-02-02 DIAGNOSIS — Z436 Encounter for attention to other artificial openings of urinary tract: Secondary | ICD-10-CM | POA: Diagnosis not present

## 2022-02-02 DIAGNOSIS — N1330 Unspecified hydronephrosis: Secondary | ICD-10-CM | POA: Diagnosis not present

## 2022-02-09 DIAGNOSIS — N185 Chronic kidney disease, stage 5: Secondary | ICD-10-CM | POA: Diagnosis not present

## 2022-02-10 ENCOUNTER — Telehealth: Payer: Self-pay

## 2022-02-10 NOTE — Telephone Encounter (Signed)
Received written order for pt regarding urine catheter. Form previously completed 05/06/21.  ? ?Please review and advise ? ?

## 2022-02-11 DIAGNOSIS — N185 Chronic kidney disease, stage 5: Secondary | ICD-10-CM | POA: Diagnosis not present

## 2022-02-11 DIAGNOSIS — E872 Acidosis, unspecified: Secondary | ICD-10-CM | POA: Diagnosis not present

## 2022-02-11 DIAGNOSIS — E1122 Type 2 diabetes mellitus with diabetic chronic kidney disease: Secondary | ICD-10-CM | POA: Diagnosis not present

## 2022-02-11 DIAGNOSIS — D631 Anemia in chronic kidney disease: Secondary | ICD-10-CM | POA: Diagnosis not present

## 2022-02-11 DIAGNOSIS — Z87891 Personal history of nicotine dependence: Secondary | ICD-10-CM | POA: Diagnosis not present

## 2022-02-11 DIAGNOSIS — Z794 Long term (current) use of insulin: Secondary | ICD-10-CM | POA: Diagnosis not present

## 2022-02-11 NOTE — Telephone Encounter (Signed)
Signed and put in box to go up front. ?Signed:  Crissie Sickles, MD           02/11/2022 ? ?

## 2022-02-19 DIAGNOSIS — R8281 Pyuria: Secondary | ICD-10-CM | POA: Diagnosis not present

## 2022-03-05 DIAGNOSIS — B9629 Other Escherichia coli [E. coli] as the cause of diseases classified elsewhere: Secondary | ICD-10-CM | POA: Diagnosis not present

## 2022-03-05 DIAGNOSIS — Z1612 Extended spectrum beta lactamase (ESBL) resistance: Secondary | ICD-10-CM | POA: Diagnosis not present

## 2022-03-05 DIAGNOSIS — N185 Chronic kidney disease, stage 5: Secondary | ICD-10-CM | POA: Diagnosis not present

## 2022-03-05 DIAGNOSIS — N390 Urinary tract infection, site not specified: Secondary | ICD-10-CM | POA: Diagnosis not present

## 2022-03-05 DIAGNOSIS — D631 Anemia in chronic kidney disease: Secondary | ICD-10-CM | POA: Diagnosis not present

## 2022-03-09 DIAGNOSIS — N185 Chronic kidney disease, stage 5: Secondary | ICD-10-CM | POA: Diagnosis not present

## 2022-03-11 DIAGNOSIS — N185 Chronic kidney disease, stage 5: Secondary | ICD-10-CM | POA: Diagnosis not present

## 2022-03-11 DIAGNOSIS — D631 Anemia in chronic kidney disease: Secondary | ICD-10-CM | POA: Diagnosis not present

## 2022-04-06 DIAGNOSIS — D631 Anemia in chronic kidney disease: Secondary | ICD-10-CM | POA: Diagnosis not present

## 2022-04-06 DIAGNOSIS — N185 Chronic kidney disease, stage 5: Secondary | ICD-10-CM | POA: Diagnosis not present

## 2022-04-07 DIAGNOSIS — D631 Anemia in chronic kidney disease: Secondary | ICD-10-CM | POA: Diagnosis not present

## 2022-04-07 DIAGNOSIS — N185 Chronic kidney disease, stage 5: Secondary | ICD-10-CM | POA: Diagnosis not present

## 2022-04-15 DIAGNOSIS — N185 Chronic kidney disease, stage 5: Secondary | ICD-10-CM | POA: Diagnosis not present

## 2022-05-04 DIAGNOSIS — N1330 Unspecified hydronephrosis: Secondary | ICD-10-CM | POA: Diagnosis not present

## 2022-05-04 DIAGNOSIS — N1339 Other hydronephrosis: Secondary | ICD-10-CM | POA: Diagnosis not present

## 2022-05-04 DIAGNOSIS — Z466 Encounter for fitting and adjustment of urinary device: Secondary | ICD-10-CM | POA: Diagnosis not present

## 2022-05-07 DIAGNOSIS — N185 Chronic kidney disease, stage 5: Secondary | ICD-10-CM | POA: Diagnosis not present

## 2022-05-13 DIAGNOSIS — E1122 Type 2 diabetes mellitus with diabetic chronic kidney disease: Secondary | ICD-10-CM | POA: Diagnosis not present

## 2022-05-13 DIAGNOSIS — E8729 Other acidosis: Secondary | ICD-10-CM | POA: Diagnosis not present

## 2022-05-13 DIAGNOSIS — N185 Chronic kidney disease, stage 5: Secondary | ICD-10-CM | POA: Diagnosis not present

## 2022-05-13 DIAGNOSIS — D631 Anemia in chronic kidney disease: Secondary | ICD-10-CM | POA: Diagnosis not present

## 2022-06-10 DIAGNOSIS — D631 Anemia in chronic kidney disease: Secondary | ICD-10-CM | POA: Diagnosis not present

## 2022-06-10 DIAGNOSIS — N185 Chronic kidney disease, stage 5: Secondary | ICD-10-CM | POA: Diagnosis not present

## 2022-06-11 DIAGNOSIS — N185 Chronic kidney disease, stage 5: Secondary | ICD-10-CM | POA: Diagnosis not present

## 2022-06-11 DIAGNOSIS — D631 Anemia in chronic kidney disease: Secondary | ICD-10-CM | POA: Diagnosis not present

## 2022-06-18 DIAGNOSIS — R319 Hematuria, unspecified: Secondary | ICD-10-CM | POA: Diagnosis not present

## 2022-06-18 DIAGNOSIS — R300 Dysuria: Secondary | ICD-10-CM | POA: Diagnosis not present

## 2022-06-22 DIAGNOSIS — E782 Mixed hyperlipidemia: Secondary | ICD-10-CM | POA: Diagnosis not present

## 2022-06-22 DIAGNOSIS — N185 Chronic kidney disease, stage 5: Secondary | ICD-10-CM | POA: Diagnosis not present

## 2022-06-22 DIAGNOSIS — D631 Anemia in chronic kidney disease: Secondary | ICD-10-CM | POA: Diagnosis not present

## 2022-06-22 DIAGNOSIS — E1122 Type 2 diabetes mellitus with diabetic chronic kidney disease: Secondary | ICD-10-CM | POA: Diagnosis not present

## 2022-07-07 DIAGNOSIS — D631 Anemia in chronic kidney disease: Secondary | ICD-10-CM | POA: Diagnosis not present

## 2022-07-07 DIAGNOSIS — N185 Chronic kidney disease, stage 5: Secondary | ICD-10-CM | POA: Diagnosis not present

## 2022-07-09 DIAGNOSIS — D631 Anemia in chronic kidney disease: Secondary | ICD-10-CM | POA: Diagnosis not present

## 2022-07-09 DIAGNOSIS — N185 Chronic kidney disease, stage 5: Secondary | ICD-10-CM | POA: Diagnosis not present

## 2022-07-23 DIAGNOSIS — N185 Chronic kidney disease, stage 5: Secondary | ICD-10-CM | POA: Diagnosis not present

## 2022-07-23 DIAGNOSIS — D631 Anemia in chronic kidney disease: Secondary | ICD-10-CM | POA: Diagnosis not present

## 2022-07-27 DIAGNOSIS — B9629 Other Escherichia coli [E. coli] as the cause of diseases classified elsewhere: Secondary | ICD-10-CM | POA: Diagnosis not present

## 2022-07-27 DIAGNOSIS — Z466 Encounter for fitting and adjustment of urinary device: Secondary | ICD-10-CM | POA: Diagnosis not present

## 2022-07-27 DIAGNOSIS — N390 Urinary tract infection, site not specified: Secondary | ICD-10-CM | POA: Diagnosis not present

## 2022-07-31 DIAGNOSIS — R030 Elevated blood-pressure reading, without diagnosis of hypertension: Secondary | ICD-10-CM | POA: Diagnosis not present

## 2022-07-31 DIAGNOSIS — C679 Malignant neoplasm of bladder, unspecified: Secondary | ICD-10-CM | POA: Diagnosis not present

## 2022-08-05 DIAGNOSIS — N185 Chronic kidney disease, stage 5: Secondary | ICD-10-CM | POA: Diagnosis not present

## 2022-08-06 DIAGNOSIS — N185 Chronic kidney disease, stage 5: Secondary | ICD-10-CM | POA: Diagnosis not present

## 2022-08-06 DIAGNOSIS — D631 Anemia in chronic kidney disease: Secondary | ICD-10-CM | POA: Diagnosis not present

## 2022-08-18 DIAGNOSIS — I120 Hypertensive chronic kidney disease with stage 5 chronic kidney disease or end stage renal disease: Secondary | ICD-10-CM | POA: Diagnosis not present

## 2022-08-18 DIAGNOSIS — E875 Hyperkalemia: Secondary | ICD-10-CM | POA: Diagnosis not present

## 2022-08-18 DIAGNOSIS — D631 Anemia in chronic kidney disease: Secondary | ICD-10-CM | POA: Diagnosis not present

## 2022-08-18 DIAGNOSIS — Z87891 Personal history of nicotine dependence: Secondary | ICD-10-CM | POA: Diagnosis not present

## 2022-08-18 DIAGNOSIS — E1122 Type 2 diabetes mellitus with diabetic chronic kidney disease: Secondary | ICD-10-CM | POA: Diagnosis not present

## 2022-08-18 DIAGNOSIS — N185 Chronic kidney disease, stage 5: Secondary | ICD-10-CM | POA: Diagnosis not present

## 2022-08-18 DIAGNOSIS — E872 Acidosis, unspecified: Secondary | ICD-10-CM | POA: Diagnosis not present

## 2022-08-18 DIAGNOSIS — Z794 Long term (current) use of insulin: Secondary | ICD-10-CM | POA: Diagnosis not present

## 2022-08-19 DIAGNOSIS — N185 Chronic kidney disease, stage 5: Secondary | ICD-10-CM | POA: Diagnosis not present

## 2022-08-19 DIAGNOSIS — Z1159 Encounter for screening for other viral diseases: Secondary | ICD-10-CM | POA: Diagnosis not present

## 2022-08-20 DIAGNOSIS — N185 Chronic kidney disease, stage 5: Secondary | ICD-10-CM | POA: Diagnosis not present

## 2022-08-20 DIAGNOSIS — N186 End stage renal disease: Secondary | ICD-10-CM | POA: Diagnosis not present

## 2022-08-20 DIAGNOSIS — Z4901 Encounter for fitting and adjustment of extracorporeal dialysis catheter: Secondary | ICD-10-CM | POA: Diagnosis not present

## 2022-08-20 DIAGNOSIS — Z794 Long term (current) use of insulin: Secondary | ICD-10-CM | POA: Diagnosis not present

## 2022-08-20 DIAGNOSIS — E1122 Type 2 diabetes mellitus with diabetic chronic kidney disease: Secondary | ICD-10-CM | POA: Diagnosis not present

## 2022-08-21 DIAGNOSIS — N186 End stage renal disease: Secondary | ICD-10-CM | POA: Diagnosis not present

## 2022-08-21 DIAGNOSIS — T8249XA Other complication of vascular dialysis catheter, initial encounter: Secondary | ICD-10-CM | POA: Diagnosis not present

## 2022-08-21 DIAGNOSIS — R252 Cramp and spasm: Secondary | ICD-10-CM | POA: Diagnosis not present

## 2022-08-21 DIAGNOSIS — I959 Hypotension, unspecified: Secondary | ICD-10-CM | POA: Diagnosis not present

## 2022-08-21 DIAGNOSIS — D631 Anemia in chronic kidney disease: Secondary | ICD-10-CM | POA: Diagnosis not present

## 2022-08-21 DIAGNOSIS — N2581 Secondary hyperparathyroidism of renal origin: Secondary | ICD-10-CM | POA: Diagnosis not present

## 2022-08-21 DIAGNOSIS — E1121 Type 2 diabetes mellitus with diabetic nephropathy: Secondary | ICD-10-CM | POA: Diagnosis not present

## 2022-08-24 DIAGNOSIS — D631 Anemia in chronic kidney disease: Secondary | ICD-10-CM | POA: Diagnosis not present

## 2022-08-24 DIAGNOSIS — N2581 Secondary hyperparathyroidism of renal origin: Secondary | ICD-10-CM | POA: Diagnosis not present

## 2022-08-24 DIAGNOSIS — T8249XA Other complication of vascular dialysis catheter, initial encounter: Secondary | ICD-10-CM | POA: Diagnosis not present

## 2022-08-24 DIAGNOSIS — I959 Hypotension, unspecified: Secondary | ICD-10-CM | POA: Diagnosis not present

## 2022-08-24 DIAGNOSIS — R252 Cramp and spasm: Secondary | ICD-10-CM | POA: Diagnosis not present

## 2022-08-24 DIAGNOSIS — N186 End stage renal disease: Secondary | ICD-10-CM | POA: Diagnosis not present

## 2022-08-26 DIAGNOSIS — N2581 Secondary hyperparathyroidism of renal origin: Secondary | ICD-10-CM | POA: Diagnosis not present

## 2022-08-26 DIAGNOSIS — T8249XA Other complication of vascular dialysis catheter, initial encounter: Secondary | ICD-10-CM | POA: Diagnosis not present

## 2022-08-26 DIAGNOSIS — N186 End stage renal disease: Secondary | ICD-10-CM | POA: Diagnosis not present

## 2022-08-26 DIAGNOSIS — R252 Cramp and spasm: Secondary | ICD-10-CM | POA: Diagnosis not present

## 2022-08-26 DIAGNOSIS — D631 Anemia in chronic kidney disease: Secondary | ICD-10-CM | POA: Diagnosis not present

## 2022-08-26 DIAGNOSIS — I959 Hypotension, unspecified: Secondary | ICD-10-CM | POA: Diagnosis not present

## 2022-08-28 DIAGNOSIS — N2581 Secondary hyperparathyroidism of renal origin: Secondary | ICD-10-CM | POA: Diagnosis not present

## 2022-08-28 DIAGNOSIS — N186 End stage renal disease: Secondary | ICD-10-CM | POA: Diagnosis not present

## 2022-08-28 DIAGNOSIS — R252 Cramp and spasm: Secondary | ICD-10-CM | POA: Diagnosis not present

## 2022-08-28 DIAGNOSIS — T8249XA Other complication of vascular dialysis catheter, initial encounter: Secondary | ICD-10-CM | POA: Diagnosis not present

## 2022-08-28 DIAGNOSIS — D631 Anemia in chronic kidney disease: Secondary | ICD-10-CM | POA: Diagnosis not present

## 2022-08-28 DIAGNOSIS — I959 Hypotension, unspecified: Secondary | ICD-10-CM | POA: Diagnosis not present

## 2022-08-31 DIAGNOSIS — D631 Anemia in chronic kidney disease: Secondary | ICD-10-CM | POA: Diagnosis not present

## 2022-08-31 DIAGNOSIS — N2581 Secondary hyperparathyroidism of renal origin: Secondary | ICD-10-CM | POA: Diagnosis not present

## 2022-08-31 DIAGNOSIS — I959 Hypotension, unspecified: Secondary | ICD-10-CM | POA: Diagnosis not present

## 2022-08-31 DIAGNOSIS — T8249XA Other complication of vascular dialysis catheter, initial encounter: Secondary | ICD-10-CM | POA: Diagnosis not present

## 2022-08-31 DIAGNOSIS — R252 Cramp and spasm: Secondary | ICD-10-CM | POA: Diagnosis not present

## 2022-08-31 DIAGNOSIS — N390 Urinary tract infection, site not specified: Secondary | ICD-10-CM | POA: Diagnosis not present

## 2022-08-31 DIAGNOSIS — R319 Hematuria, unspecified: Secondary | ICD-10-CM | POA: Diagnosis not present

## 2022-08-31 DIAGNOSIS — T83511A Infection and inflammatory reaction due to indwelling urethral catheter, initial encounter: Secondary | ICD-10-CM | POA: Diagnosis not present

## 2022-08-31 DIAGNOSIS — N186 End stage renal disease: Secondary | ICD-10-CM | POA: Diagnosis not present

## 2022-09-01 DIAGNOSIS — N186 End stage renal disease: Secondary | ICD-10-CM | POA: Diagnosis not present

## 2022-09-01 DIAGNOSIS — Z992 Dependence on renal dialysis: Secondary | ICD-10-CM | POA: Diagnosis not present

## 2022-09-02 DIAGNOSIS — D631 Anemia in chronic kidney disease: Secondary | ICD-10-CM | POA: Diagnosis not present

## 2022-09-02 DIAGNOSIS — N2581 Secondary hyperparathyroidism of renal origin: Secondary | ICD-10-CM | POA: Diagnosis not present

## 2022-09-02 DIAGNOSIS — D509 Iron deficiency anemia, unspecified: Secondary | ICD-10-CM | POA: Diagnosis not present

## 2022-09-02 DIAGNOSIS — E1122 Type 2 diabetes mellitus with diabetic chronic kidney disease: Secondary | ICD-10-CM | POA: Diagnosis not present

## 2022-09-02 DIAGNOSIS — N186 End stage renal disease: Secondary | ICD-10-CM | POA: Diagnosis not present

## 2022-09-04 DIAGNOSIS — Z466 Encounter for fitting and adjustment of urinary device: Secondary | ICD-10-CM | POA: Diagnosis not present

## 2022-09-04 DIAGNOSIS — N2581 Secondary hyperparathyroidism of renal origin: Secondary | ICD-10-CM | POA: Diagnosis not present

## 2022-09-04 DIAGNOSIS — D509 Iron deficiency anemia, unspecified: Secondary | ICD-10-CM | POA: Diagnosis not present

## 2022-09-04 DIAGNOSIS — N1330 Unspecified hydronephrosis: Secondary | ICD-10-CM | POA: Diagnosis not present

## 2022-09-04 DIAGNOSIS — D631 Anemia in chronic kidney disease: Secondary | ICD-10-CM | POA: Diagnosis not present

## 2022-09-04 DIAGNOSIS — N186 End stage renal disease: Secondary | ICD-10-CM | POA: Diagnosis not present

## 2022-09-07 DIAGNOSIS — D631 Anemia in chronic kidney disease: Secondary | ICD-10-CM | POA: Diagnosis not present

## 2022-09-07 DIAGNOSIS — N2581 Secondary hyperparathyroidism of renal origin: Secondary | ICD-10-CM | POA: Diagnosis not present

## 2022-09-07 DIAGNOSIS — N186 End stage renal disease: Secondary | ICD-10-CM | POA: Diagnosis not present

## 2022-09-07 DIAGNOSIS — D509 Iron deficiency anemia, unspecified: Secondary | ICD-10-CM | POA: Diagnosis not present

## 2022-09-09 DIAGNOSIS — D631 Anemia in chronic kidney disease: Secondary | ICD-10-CM | POA: Diagnosis not present

## 2022-09-09 DIAGNOSIS — D509 Iron deficiency anemia, unspecified: Secondary | ICD-10-CM | POA: Diagnosis not present

## 2022-09-09 DIAGNOSIS — N186 End stage renal disease: Secondary | ICD-10-CM | POA: Diagnosis not present

## 2022-09-09 DIAGNOSIS — N2581 Secondary hyperparathyroidism of renal origin: Secondary | ICD-10-CM | POA: Diagnosis not present

## 2022-09-11 DIAGNOSIS — D631 Anemia in chronic kidney disease: Secondary | ICD-10-CM | POA: Diagnosis not present

## 2022-09-11 DIAGNOSIS — N186 End stage renal disease: Secondary | ICD-10-CM | POA: Diagnosis not present

## 2022-09-11 DIAGNOSIS — D509 Iron deficiency anemia, unspecified: Secondary | ICD-10-CM | POA: Diagnosis not present

## 2022-09-11 DIAGNOSIS — N2581 Secondary hyperparathyroidism of renal origin: Secondary | ICD-10-CM | POA: Diagnosis not present

## 2022-09-14 DIAGNOSIS — N2581 Secondary hyperparathyroidism of renal origin: Secondary | ICD-10-CM | POA: Diagnosis not present

## 2022-09-14 DIAGNOSIS — N186 End stage renal disease: Secondary | ICD-10-CM | POA: Diagnosis not present

## 2022-09-14 DIAGNOSIS — D509 Iron deficiency anemia, unspecified: Secondary | ICD-10-CM | POA: Diagnosis not present

## 2022-09-14 DIAGNOSIS — D631 Anemia in chronic kidney disease: Secondary | ICD-10-CM | POA: Diagnosis not present

## 2022-09-16 DIAGNOSIS — N2581 Secondary hyperparathyroidism of renal origin: Secondary | ICD-10-CM | POA: Diagnosis not present

## 2022-09-16 DIAGNOSIS — D509 Iron deficiency anemia, unspecified: Secondary | ICD-10-CM | POA: Diagnosis not present

## 2022-09-16 DIAGNOSIS — N186 End stage renal disease: Secondary | ICD-10-CM | POA: Diagnosis not present

## 2022-09-16 DIAGNOSIS — D631 Anemia in chronic kidney disease: Secondary | ICD-10-CM | POA: Diagnosis not present

## 2022-09-18 DIAGNOSIS — D509 Iron deficiency anemia, unspecified: Secondary | ICD-10-CM | POA: Diagnosis not present

## 2022-09-18 DIAGNOSIS — N186 End stage renal disease: Secondary | ICD-10-CM | POA: Diagnosis not present

## 2022-09-18 DIAGNOSIS — N2581 Secondary hyperparathyroidism of renal origin: Secondary | ICD-10-CM | POA: Diagnosis not present

## 2022-09-18 DIAGNOSIS — D631 Anemia in chronic kidney disease: Secondary | ICD-10-CM | POA: Diagnosis not present

## 2022-09-20 DIAGNOSIS — N186 End stage renal disease: Secondary | ICD-10-CM | POA: Diagnosis not present

## 2022-09-20 DIAGNOSIS — D631 Anemia in chronic kidney disease: Secondary | ICD-10-CM | POA: Diagnosis not present

## 2022-09-20 DIAGNOSIS — N2581 Secondary hyperparathyroidism of renal origin: Secondary | ICD-10-CM | POA: Diagnosis not present

## 2022-09-20 DIAGNOSIS — D509 Iron deficiency anemia, unspecified: Secondary | ICD-10-CM | POA: Diagnosis not present

## 2022-09-22 DIAGNOSIS — D631 Anemia in chronic kidney disease: Secondary | ICD-10-CM | POA: Diagnosis not present

## 2022-09-22 DIAGNOSIS — N2581 Secondary hyperparathyroidism of renal origin: Secondary | ICD-10-CM | POA: Diagnosis not present

## 2022-09-22 DIAGNOSIS — N186 End stage renal disease: Secondary | ICD-10-CM | POA: Diagnosis not present

## 2022-09-22 DIAGNOSIS — D509 Iron deficiency anemia, unspecified: Secondary | ICD-10-CM | POA: Diagnosis not present

## 2022-09-25 DIAGNOSIS — D631 Anemia in chronic kidney disease: Secondary | ICD-10-CM | POA: Diagnosis not present

## 2022-09-25 DIAGNOSIS — N2581 Secondary hyperparathyroidism of renal origin: Secondary | ICD-10-CM | POA: Diagnosis not present

## 2022-09-25 DIAGNOSIS — N186 End stage renal disease: Secondary | ICD-10-CM | POA: Diagnosis not present

## 2022-09-25 DIAGNOSIS — D509 Iron deficiency anemia, unspecified: Secondary | ICD-10-CM | POA: Diagnosis not present

## 2022-09-28 DIAGNOSIS — D631 Anemia in chronic kidney disease: Secondary | ICD-10-CM | POA: Diagnosis not present

## 2022-09-28 DIAGNOSIS — D509 Iron deficiency anemia, unspecified: Secondary | ICD-10-CM | POA: Diagnosis not present

## 2022-09-28 DIAGNOSIS — N2581 Secondary hyperparathyroidism of renal origin: Secondary | ICD-10-CM | POA: Diagnosis not present

## 2022-09-28 DIAGNOSIS — N186 End stage renal disease: Secondary | ICD-10-CM | POA: Diagnosis not present

## 2022-09-30 DIAGNOSIS — N186 End stage renal disease: Secondary | ICD-10-CM | POA: Diagnosis not present

## 2022-09-30 DIAGNOSIS — N2581 Secondary hyperparathyroidism of renal origin: Secondary | ICD-10-CM | POA: Diagnosis not present

## 2022-09-30 DIAGNOSIS — D509 Iron deficiency anemia, unspecified: Secondary | ICD-10-CM | POA: Diagnosis not present

## 2022-09-30 DIAGNOSIS — D631 Anemia in chronic kidney disease: Secondary | ICD-10-CM | POA: Diagnosis not present

## 2022-10-01 DIAGNOSIS — N186 End stage renal disease: Secondary | ICD-10-CM | POA: Diagnosis not present

## 2022-10-01 DIAGNOSIS — Z992 Dependence on renal dialysis: Secondary | ICD-10-CM | POA: Diagnosis not present

## 2022-10-02 DIAGNOSIS — D509 Iron deficiency anemia, unspecified: Secondary | ICD-10-CM | POA: Diagnosis not present

## 2022-10-02 DIAGNOSIS — N2581 Secondary hyperparathyroidism of renal origin: Secondary | ICD-10-CM | POA: Diagnosis not present

## 2022-10-02 DIAGNOSIS — N186 End stage renal disease: Secondary | ICD-10-CM | POA: Diagnosis not present

## 2022-10-02 DIAGNOSIS — D631 Anemia in chronic kidney disease: Secondary | ICD-10-CM | POA: Diagnosis not present

## 2022-10-05 DIAGNOSIS — N186 End stage renal disease: Secondary | ICD-10-CM | POA: Diagnosis not present

## 2022-10-05 DIAGNOSIS — N2581 Secondary hyperparathyroidism of renal origin: Secondary | ICD-10-CM | POA: Diagnosis not present

## 2022-10-05 DIAGNOSIS — E1122 Type 2 diabetes mellitus with diabetic chronic kidney disease: Secondary | ICD-10-CM | POA: Diagnosis not present

## 2022-10-05 DIAGNOSIS — I120 Hypertensive chronic kidney disease with stage 5 chronic kidney disease or end stage renal disease: Secondary | ICD-10-CM | POA: Diagnosis not present

## 2022-10-05 DIAGNOSIS — Z01810 Encounter for preprocedural cardiovascular examination: Secondary | ICD-10-CM | POA: Diagnosis not present

## 2022-10-05 DIAGNOSIS — D631 Anemia in chronic kidney disease: Secondary | ICD-10-CM | POA: Diagnosis not present

## 2022-10-05 DIAGNOSIS — D509 Iron deficiency anemia, unspecified: Secondary | ICD-10-CM | POA: Diagnosis not present

## 2022-10-05 DIAGNOSIS — Z859 Personal history of malignant neoplasm, unspecified: Secondary | ICD-10-CM | POA: Diagnosis not present

## 2022-10-05 DIAGNOSIS — Z992 Dependence on renal dialysis: Secondary | ICD-10-CM | POA: Diagnosis not present

## 2022-10-05 DIAGNOSIS — Z87891 Personal history of nicotine dependence: Secondary | ICD-10-CM | POA: Diagnosis not present

## 2022-10-07 DIAGNOSIS — D631 Anemia in chronic kidney disease: Secondary | ICD-10-CM | POA: Diagnosis not present

## 2022-10-07 DIAGNOSIS — N186 End stage renal disease: Secondary | ICD-10-CM | POA: Diagnosis not present

## 2022-10-07 DIAGNOSIS — E1122 Type 2 diabetes mellitus with diabetic chronic kidney disease: Secondary | ICD-10-CM | POA: Diagnosis not present

## 2022-10-07 DIAGNOSIS — D509 Iron deficiency anemia, unspecified: Secondary | ICD-10-CM | POA: Diagnosis not present

## 2022-10-07 DIAGNOSIS — N2581 Secondary hyperparathyroidism of renal origin: Secondary | ICD-10-CM | POA: Diagnosis not present

## 2022-10-09 DIAGNOSIS — D631 Anemia in chronic kidney disease: Secondary | ICD-10-CM | POA: Diagnosis not present

## 2022-10-09 DIAGNOSIS — D509 Iron deficiency anemia, unspecified: Secondary | ICD-10-CM | POA: Diagnosis not present

## 2022-10-09 DIAGNOSIS — N2581 Secondary hyperparathyroidism of renal origin: Secondary | ICD-10-CM | POA: Diagnosis not present

## 2022-10-09 DIAGNOSIS — N186 End stage renal disease: Secondary | ICD-10-CM | POA: Diagnosis not present

## 2022-10-12 DIAGNOSIS — D631 Anemia in chronic kidney disease: Secondary | ICD-10-CM | POA: Diagnosis not present

## 2022-10-12 DIAGNOSIS — N2581 Secondary hyperparathyroidism of renal origin: Secondary | ICD-10-CM | POA: Diagnosis not present

## 2022-10-12 DIAGNOSIS — N186 End stage renal disease: Secondary | ICD-10-CM | POA: Diagnosis not present

## 2022-10-12 DIAGNOSIS — R319 Hematuria, unspecified: Secondary | ICD-10-CM | POA: Diagnosis not present

## 2022-10-12 DIAGNOSIS — D509 Iron deficiency anemia, unspecified: Secondary | ICD-10-CM | POA: Diagnosis not present

## 2022-10-13 DIAGNOSIS — D631 Anemia in chronic kidney disease: Secondary | ICD-10-CM | POA: Diagnosis not present

## 2022-10-13 DIAGNOSIS — N2581 Secondary hyperparathyroidism of renal origin: Secondary | ICD-10-CM | POA: Diagnosis not present

## 2022-10-13 DIAGNOSIS — N186 End stage renal disease: Secondary | ICD-10-CM | POA: Diagnosis not present

## 2022-10-13 DIAGNOSIS — D509 Iron deficiency anemia, unspecified: Secondary | ICD-10-CM | POA: Diagnosis not present

## 2022-10-14 DIAGNOSIS — N186 End stage renal disease: Secondary | ICD-10-CM | POA: Diagnosis not present

## 2022-10-14 DIAGNOSIS — Z87891 Personal history of nicotine dependence: Secondary | ICD-10-CM | POA: Diagnosis not present

## 2022-10-14 DIAGNOSIS — N189 Chronic kidney disease, unspecified: Secondary | ICD-10-CM | POA: Diagnosis not present

## 2022-10-14 DIAGNOSIS — E1122 Type 2 diabetes mellitus with diabetic chronic kidney disease: Secondary | ICD-10-CM | POA: Diagnosis not present

## 2022-10-14 DIAGNOSIS — I120 Hypertensive chronic kidney disease with stage 5 chronic kidney disease or end stage renal disease: Secondary | ICD-10-CM | POA: Diagnosis not present

## 2022-10-14 DIAGNOSIS — Z794 Long term (current) use of insulin: Secondary | ICD-10-CM | POA: Diagnosis not present

## 2022-10-16 DIAGNOSIS — N186 End stage renal disease: Secondary | ICD-10-CM | POA: Diagnosis not present

## 2022-10-16 DIAGNOSIS — D509 Iron deficiency anemia, unspecified: Secondary | ICD-10-CM | POA: Diagnosis not present

## 2022-10-16 DIAGNOSIS — D631 Anemia in chronic kidney disease: Secondary | ICD-10-CM | POA: Diagnosis not present

## 2022-10-16 DIAGNOSIS — N2581 Secondary hyperparathyroidism of renal origin: Secondary | ICD-10-CM | POA: Diagnosis not present

## 2022-10-17 DIAGNOSIS — Z01810 Encounter for preprocedural cardiovascular examination: Secondary | ICD-10-CM | POA: Diagnosis not present

## 2022-10-19 DIAGNOSIS — D631 Anemia in chronic kidney disease: Secondary | ICD-10-CM | POA: Diagnosis not present

## 2022-10-19 DIAGNOSIS — N2581 Secondary hyperparathyroidism of renal origin: Secondary | ICD-10-CM | POA: Diagnosis not present

## 2022-10-19 DIAGNOSIS — D509 Iron deficiency anemia, unspecified: Secondary | ICD-10-CM | POA: Diagnosis not present

## 2022-10-19 DIAGNOSIS — N186 End stage renal disease: Secondary | ICD-10-CM | POA: Diagnosis not present

## 2022-10-21 DIAGNOSIS — N186 End stage renal disease: Secondary | ICD-10-CM | POA: Diagnosis not present

## 2022-10-21 DIAGNOSIS — N2581 Secondary hyperparathyroidism of renal origin: Secondary | ICD-10-CM | POA: Diagnosis not present

## 2022-10-21 DIAGNOSIS — D509 Iron deficiency anemia, unspecified: Secondary | ICD-10-CM | POA: Diagnosis not present

## 2022-10-21 DIAGNOSIS — D631 Anemia in chronic kidney disease: Secondary | ICD-10-CM | POA: Diagnosis not present

## 2022-10-23 DIAGNOSIS — N186 End stage renal disease: Secondary | ICD-10-CM | POA: Diagnosis not present

## 2022-10-23 DIAGNOSIS — D631 Anemia in chronic kidney disease: Secondary | ICD-10-CM | POA: Diagnosis not present

## 2022-10-23 DIAGNOSIS — N2581 Secondary hyperparathyroidism of renal origin: Secondary | ICD-10-CM | POA: Diagnosis not present

## 2022-10-23 DIAGNOSIS — D509 Iron deficiency anemia, unspecified: Secondary | ICD-10-CM | POA: Diagnosis not present

## 2022-10-25 DIAGNOSIS — D509 Iron deficiency anemia, unspecified: Secondary | ICD-10-CM | POA: Diagnosis not present

## 2022-10-25 DIAGNOSIS — N2581 Secondary hyperparathyroidism of renal origin: Secondary | ICD-10-CM | POA: Diagnosis not present

## 2022-10-25 DIAGNOSIS — D631 Anemia in chronic kidney disease: Secondary | ICD-10-CM | POA: Diagnosis not present

## 2022-10-25 DIAGNOSIS — N186 End stage renal disease: Secondary | ICD-10-CM | POA: Diagnosis not present

## 2022-10-28 DIAGNOSIS — N2581 Secondary hyperparathyroidism of renal origin: Secondary | ICD-10-CM | POA: Diagnosis not present

## 2022-10-28 DIAGNOSIS — N186 End stage renal disease: Secondary | ICD-10-CM | POA: Diagnosis not present

## 2022-10-28 DIAGNOSIS — D631 Anemia in chronic kidney disease: Secondary | ICD-10-CM | POA: Diagnosis not present

## 2022-10-28 DIAGNOSIS — D509 Iron deficiency anemia, unspecified: Secondary | ICD-10-CM | POA: Diagnosis not present

## 2022-10-30 DIAGNOSIS — N2581 Secondary hyperparathyroidism of renal origin: Secondary | ICD-10-CM | POA: Diagnosis not present

## 2022-10-30 DIAGNOSIS — N186 End stage renal disease: Secondary | ICD-10-CM | POA: Diagnosis not present

## 2022-10-30 DIAGNOSIS — D631 Anemia in chronic kidney disease: Secondary | ICD-10-CM | POA: Diagnosis not present

## 2022-10-30 DIAGNOSIS — D509 Iron deficiency anemia, unspecified: Secondary | ICD-10-CM | POA: Diagnosis not present

## 2022-11-01 DIAGNOSIS — N186 End stage renal disease: Secondary | ICD-10-CM | POA: Diagnosis not present

## 2022-11-01 DIAGNOSIS — D509 Iron deficiency anemia, unspecified: Secondary | ICD-10-CM | POA: Diagnosis not present

## 2022-11-01 DIAGNOSIS — D631 Anemia in chronic kidney disease: Secondary | ICD-10-CM | POA: Diagnosis not present

## 2022-11-01 DIAGNOSIS — Z992 Dependence on renal dialysis: Secondary | ICD-10-CM | POA: Diagnosis not present

## 2022-11-01 DIAGNOSIS — N2581 Secondary hyperparathyroidism of renal origin: Secondary | ICD-10-CM | POA: Diagnosis not present

## 2022-12-21 ENCOUNTER — Telehealth: Payer: Self-pay

## 2022-12-21 NOTE — Telephone Encounter (Signed)
LVM for pt to CB and schedule Medicare Annual Wellness Visit (AWV).   ?
